# Patient Record
Sex: Male | Born: 1940 | Race: White | Hispanic: No | Marital: Married | Smoking: Former smoker
Health system: Southern US, Community
[De-identification: ages and names within clinical notes are randomized; demographics above are authoritative.]

## PROBLEM LIST (undated history)

## (undated) DIAGNOSIS — I4891 Unspecified atrial fibrillation: Secondary | ICD-10-CM

## (undated) DIAGNOSIS — I509 Heart failure, unspecified: Secondary | ICD-10-CM

## (undated) DIAGNOSIS — Z950 Presence of cardiac pacemaker: Secondary | ICD-10-CM

## (undated) DIAGNOSIS — E119 Type 2 diabetes mellitus without complications: Secondary | ICD-10-CM

## (undated) DIAGNOSIS — Z9289 Personal history of other medical treatment: Secondary | ICD-10-CM

## (undated) DIAGNOSIS — I10 Essential (primary) hypertension: Secondary | ICD-10-CM

## (undated) DIAGNOSIS — J449 Chronic obstructive pulmonary disease, unspecified: Secondary | ICD-10-CM

## (undated) HISTORY — PX: PACEMAKER INSERTION: SHX728

## (undated) HISTORY — PX: CARDIAC SURGERY: SHX584

## (undated) HISTORY — DX: Type 2 diabetes mellitus without complications: E11.9

---

## 2017-08-21 ENCOUNTER — Emergency Department
Admission: EM | Admit: 2017-08-21 | Discharge: 2017-08-21 | Disposition: A | Discharge status: Home / Self Care | Payer: Medicare Other | Attending: Emergency Medicine | Admitting: Emergency Medicine

## 2017-08-21 ENCOUNTER — Other Ambulatory Visit: Payer: Self-pay

## 2017-08-21 ENCOUNTER — Emergency Department: Payer: Medicare Other

## 2017-08-21 DIAGNOSIS — S5012XA Contusion of left forearm, initial encounter: Secondary | ICD-10-CM | POA: Diagnosis not present

## 2017-08-21 DIAGNOSIS — W208XXA Other cause of strike by thrown, projected or falling object, initial encounter: Secondary | ICD-10-CM | POA: Insufficient documentation

## 2017-08-21 DIAGNOSIS — Z87891 Personal history of nicotine dependence: Secondary | ICD-10-CM | POA: Diagnosis not present

## 2017-08-21 DIAGNOSIS — S51819A Laceration without foreign body of unspecified forearm, initial encounter: Secondary | ICD-10-CM

## 2017-08-21 DIAGNOSIS — Y9389 Activity, other specified: Secondary | ICD-10-CM | POA: Insufficient documentation

## 2017-08-21 DIAGNOSIS — J449 Chronic obstructive pulmonary disease, unspecified: Secondary | ICD-10-CM | POA: Insufficient documentation

## 2017-08-21 DIAGNOSIS — I110 Hypertensive heart disease with heart failure: Secondary | ICD-10-CM | POA: Diagnosis not present

## 2017-08-21 DIAGNOSIS — Y999 Unspecified external cause status: Secondary | ICD-10-CM | POA: Insufficient documentation

## 2017-08-21 DIAGNOSIS — S59912A Unspecified injury of left forearm, initial encounter: Secondary | ICD-10-CM | POA: Diagnosis present

## 2017-08-21 DIAGNOSIS — Y929 Unspecified place or not applicable: Secondary | ICD-10-CM | POA: Diagnosis not present

## 2017-08-21 DIAGNOSIS — Z950 Presence of cardiac pacemaker: Secondary | ICD-10-CM | POA: Insufficient documentation

## 2017-08-21 DIAGNOSIS — I509 Heart failure, unspecified: Secondary | ICD-10-CM | POA: Diagnosis not present

## 2017-08-21 HISTORY — DX: Heart failure, unspecified: I50.9

## 2017-08-21 HISTORY — DX: Essential (primary) hypertension: I10

## 2017-08-21 HISTORY — DX: Personal history of other medical treatment: Z92.89

## 2017-08-21 HISTORY — DX: Chronic obstructive pulmonary disease, unspecified: J44.9

## 2017-08-21 HISTORY — DX: Presence of cardiac pacemaker: Z95.0

## 2017-08-21 MED ORDER — TRAMADOL HCL 50 MG PO TABS: 50.0000 mg | ORAL_TABLET | Freq: Four times a day (QID) | ORAL | 0 refills | Status: DC | PRN

## 2017-08-21 MED ORDER — TETANUS-DIPHTH-ACELL PERTUSSIS 5-2.5-18.5 LF-MCG/0.5 IM SUSP
0.5000 mL | Freq: Once | INTRAMUSCULAR | Status: AC
  Administered 2017-08-21: 0.5 mL via INTRAMUSCULAR
  Filled 2017-08-21: qty 0.5

## 2017-08-21 NOTE — ED Notes (Signed)
See triage note  States he lifted up the top of a coffee table  The lid didn't lock and came down onto left forearm  Skin tear noted to forearm

## 2017-08-21 NOTE — ED Provider Notes (Signed)
[facility] Emergency Department Provider Note   ____________________________________________   First MD Initiated Contact with Patient 08/21/17 1721     (approximate)  I have reviewed the triage vital signs and the nursing notes.   HISTORY  Chief Complaint Arm Injury   HPI Guy Castillo is a 76 y.o. male is here after an injury to his left forearm. Patient states she was lifting a cedar coffee table top when it came down on his arm. Bleeding is under control. Patient is uncertain when the last tetanus booster he had.Patient has continued to use his arm without any difficulty.he rates his pain as 2/10.   Past Medical History:  Diagnosis Date  . CHF (congestive heart failure) ([place])   . COPD (chronic obstructive pulmonary disease) ([place])   . History of blood transfusion   . Hypertension   . Pacemaker     There are no active problems to display for this patient.   Past Surgical History:  Procedure Laterality Date  . CARDIAC SURGERY    . PACEMAKER INSERTION      Prior to Admission medications   Medication Sig Start Date End Date Taking? Authorizing Provider  traMADol (ULTRAM) 50 MG tablet Take 1 tablet (50 mg total) by mouth every 6 (six) hours as needed for moderate pain. 08/21/17   [name], PA-C    Allergies Tape  History reviewed. No pertinent family history.  Social History Social History   Tobacco Use  . Smoking status: Former Smoker  Substance Use Topics  . Alcohol use: Yes  . Drug use: Not on file    Review of Systems Constitutional: No fever/chills Cardiovascular: Denies chest pain. Respiratory: Denies shortness of breath.  Gastrointestinal: No abdominal pain.  No nausea, no vomiting.  Musculoskeletal: Negative for back pain. Skin: positive for skin tear left forearm. Neurological: Negative for headaches, focal weakness or numbness. ____________________________________________   PHYSICAL EXAM:  VITAL  SIGNS: ED Triage Vitals  Enc Vitals Group     BP 08/21/17 1658 (!) 162/104     Pulse Rate 08/21/17 1658 94     Resp 08/21/17 1658 (!) 22     Temp 08/21/17 1658 99.1 F (37.3 C)     Temp Source 08/21/17 1658 Oral     SpO2 08/21/17 1658 94 %     Weight 08/21/17 1659 221 lb (100.2 kg)     Height 08/21/17 1659 5' 10.5" (1.791 m)     Head Circumference --      Peak Flow --      Pain Score 08/21/17 1705 2     Pain Loc --      Pain Edu? --      Excl. in [place]? --    Constitutional: Alert and oriented. Well appearing and in no acute distress. Eyes: Conjunctivae are normal.  Head: Atraumatic. Neck: No stridor.  Cardiovascular: Normal rate, regular rhythm. Grossly normal heart sounds.  Good peripheral circulation. Respiratory: Normal respiratory effort.  No retractions. Lungs CTAB. Musculoskeletal: range of motion of the left forearm and wrist is without restriction and with minimal discomfort. Neurologic:  Normal speech and language. No gross focal neurologic deficits are appreciated. No gait instability. Skin:  Skin is warm, dry.  Superficial skin tear on the dorsal aspect of the left forearm. No active bleeding is present. No foreign body is noted. Psychiatric: Mood and affect are normal. Speech and behavior are normal.  ____________________________________________   LABS (all labs ordered are listed, but only abnormal  results are displayed)  Labs Reviewed - No data to display  RADIOLOGY  Dg Forearm Left  Result Date: 08/21/2017 CLINICAL DATA:  Coffee table fell on forearm. EXAM: LEFT FOREARM - 2 VIEW COMPARISON:  None. FINDINGS: There is no evidence of fracture or other focal bone lesions. Osteopenia. Soft tissues are unremarkable. IMPRESSION: Negative. Electronically Signed   By: Titus Dubin M.D.   On: 08/21/2017 17:56    ____________________________________________   PROCEDURES  Procedure(s) performed: None  Procedures  Critical Care performed:  No  ____________________________________________   INITIAL IMPRESSION / ASSESSMENT AND PLAN / ED COURSE  Tetanus booster was given to the patient. The skin tear was dressed with restore and patient was instructed to leave this on for 72 hours. He is follow-up with his PCP if any continued problems. X-rays were reassuring that there is no bone injury during this accident. Patient was given a prescription for tramadol 50 mg 1 every 6 hours if needed for pain. Patient states that he will try Tylenol first before taking pain medication.  ____________________________________________   FINAL CLINICAL IMPRESSION(S) / ED DIAGNOSES  Final diagnoses:  Contusion of left forearm, initial encounter  Skin tear of forearm without complication, initial encounter     ED Discharge Orders        Ordered    traMADol (ULTRAM) 50 MG tablet  Every 6 hours PRN     08/21/17 1809       Note:  This document was prepared using Dragon voice recognition software and may include unintentional dictation errors.    [name], PA-C 08/21/17 2302    Earleen Newport, MD 08/21/17 [phone]

## 2017-08-21 NOTE — ED Triage Notes (Signed)
Pt arrives with L FA skin tear from wooden table. Bleeding controlled. Gauze over arm. Takes eliquis. Denies hitting any other part of body. Tetanus NOT utd.

## 2017-08-21 NOTE — Discharge Instructions (Signed)
Follow up with your primary care provider if any continued problems. Restore was placed to your skin tear. You will  leave dressing on for 72 hours.  Ice and elevate if needed for swelling and pain. Tramadol every 6 hours if needed for pain. This medication could cause drowsiness and increase your risk for falling.

## 2018-01-15 ENCOUNTER — Telehealth: Payer: Self-pay | Admitting: Podiatry

## 2018-01-15 ENCOUNTER — Ambulatory Visit (INDEPENDENT_AMBULATORY_CARE_PROVIDER_SITE_OTHER): Payer: Medicare Other | Admitting: Podiatry

## 2018-01-15 ENCOUNTER — Encounter: Payer: Self-pay | Admitting: Podiatry

## 2018-01-15 VITALS — BP 168/86 | HR 78

## 2018-01-15 DIAGNOSIS — L84 Corns and callosities: Secondary | ICD-10-CM | POA: Diagnosis not present

## 2018-01-15 DIAGNOSIS — E11628 Type 2 diabetes mellitus with other skin complications: Secondary | ICD-10-CM | POA: Diagnosis not present

## 2018-01-15 DIAGNOSIS — S98132S Complete traumatic amputation of one left lesser toe, sequela: Secondary | ICD-10-CM | POA: Diagnosis not present

## 2018-01-15 NOTE — Progress Notes (Signed)
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  He says this calluses painful walking and wearing his shoes.  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  This patient has become diabetic and is taking metformin.  He presents the office today for an evaluation of his painful callus, left foot.  General Appearance  Alert, conversant and in no acute stress.  Vascular  Dorsalis pedis and posterior tibial  pulses are palpable  bilaterally.  Capillary return is within normal limits  bilaterally. Temperature is within normal limits  bilaterally.  Neurologic  Senn-Weinstein monofilament wire test diminished  bilaterally. Muscle power within normal limits bilaterally.  Nails normal  nails noted with no evidence of bacterial or fungal infection five toes right foot.  Orthopedic  No limitations of motion of motion feet .  No crepitus or effusions noted.  No bony pathology or digital deformities noted. Amputation 1-5 toes left foot.  Skin  normotropic skin with no porokeratosis noted bilaterally.  No signs of infections or ulcers noted.  Callus noted at the distal aspect of the first metatarsal head, left foot.  There is also a localized skin lesion noted under the third metatarsal left  Foot  Pre-ulcerous callus left foot.    IE.  Debridement of callus noted at the site of the amputation left foot.  Skin lesion was to provided under the third metatarsal and found a hair  noted at the site.  No infection.  RTC 4 months.   Gardiner Barefoot DPM

## 2018-01-15 NOTE — Telephone Encounter (Signed)
[facility] would like to have notes faxed to [phone] ATTN: Laqueta Linden

## 2018-01-15 NOTE — Telephone Encounter (Signed)
Entered in error - please disregard

## 2018-01-16 ENCOUNTER — Telehealth: Payer: Self-pay | Admitting: Podiatry

## 2018-01-16 NOTE — Telephone Encounter (Signed)
This is Laqueta Linden calling from the [facility]. I'm calling to obtain the office visit notes from date of service 15 Jan 2018. If you could fax those to me at [phone]. If you have any questions, you can call [phone] and ask to speak to me. Thank you and have a great day.

## 2018-05-01 ENCOUNTER — Encounter: Payer: Self-pay | Admitting: Podiatry

## 2018-05-01 ENCOUNTER — Ambulatory Visit (INDEPENDENT_AMBULATORY_CARE_PROVIDER_SITE_OTHER): Payer: Medicare Other | Admitting: Podiatry

## 2018-05-01 DIAGNOSIS — T148XXA Other injury of unspecified body region, initial encounter: Secondary | ICD-10-CM

## 2018-05-03 NOTE — Progress Notes (Signed)
   HPI: 77 year old male with PMHx of DM presenting today with a chief complaint of stabbing pain to the right heel that began 2-3 days ago. He states walking increases the pain. He believes he has a plantar wart or a callus present. He has been picking at the area trying to fix it. Patient is here for further evaluation and treatment.   Past Medical History:  Diagnosis Date  . CHF (congestive heart failure) ([place])   . COPD (chronic obstructive pulmonary disease) ([place])   . Diabetes mellitus without complication ([place])   . History of blood transfusion   . Hypertension   . Pacemaker      Physical Exam: General: The patient is alert and oriented x3 in no acute distress.  Dermatology: Splinter object noted to the right plantar heel. Skin is warm, dry and supple bilateral lower extremities.   Vascular: Palpable pedal pulses bilaterally. No edema or erythema noted. Capillary refill within normal limits.  Neurological: Epicritic and protective threshold grossly intact bilaterally.   Musculoskeletal Exam: Range of motion within normal limits to all pedal and ankle joints bilateral. Muscle strength 5/5 in all groups bilateral.   Assessment: 1. Splinter object right plantar heel    Plan of Care:  1. Patient evaluated.  2. Splinter removed with debridement with tissue nipper.  3. Recommended applying antibiotic ointment daily for one week with a Band-Aid.  4. Return to clinic as needed.      Edrick Kins, DPM [facility]  Dr. Edrick Kins, DPM    [address], [place] [address]                Office [phone]  Fax [phone]

## 2018-05-14 ENCOUNTER — Ambulatory Visit: Payer: Non-veteran care | Admitting: Podiatry

## 2019-05-01 ENCOUNTER — Encounter: Payer: Self-pay | Admitting: Emergency Medicine

## 2019-05-01 ENCOUNTER — Emergency Department
Admission: EM | Admit: 2019-05-01 | Discharge: 2019-05-01 | Disposition: A | Discharge status: Home / Self Care | Payer: No Typology Code available for payment source | Attending: Emergency Medicine | Admitting: Emergency Medicine

## 2019-05-01 ENCOUNTER — Other Ambulatory Visit: Payer: Self-pay

## 2019-05-01 ENCOUNTER — Emergency Department: Payer: No Typology Code available for payment source

## 2019-05-01 DIAGNOSIS — R410 Disorientation, unspecified: Secondary | ICD-10-CM | POA: Diagnosis not present

## 2019-05-01 DIAGNOSIS — I509 Heart failure, unspecified: Secondary | ICD-10-CM | POA: Insufficient documentation

## 2019-05-01 DIAGNOSIS — E119 Type 2 diabetes mellitus without complications: Secondary | ICD-10-CM | POA: Diagnosis not present

## 2019-05-01 DIAGNOSIS — R4182 Altered mental status, unspecified: Secondary | ICD-10-CM | POA: Diagnosis present

## 2019-05-01 DIAGNOSIS — Z950 Presence of cardiac pacemaker: Secondary | ICD-10-CM | POA: Insufficient documentation

## 2019-05-01 DIAGNOSIS — Z79899 Other long term (current) drug therapy: Secondary | ICD-10-CM | POA: Insufficient documentation

## 2019-05-01 DIAGNOSIS — J449 Chronic obstructive pulmonary disease, unspecified: Secondary | ICD-10-CM | POA: Insufficient documentation

## 2019-05-01 DIAGNOSIS — Z7982 Long term (current) use of aspirin: Secondary | ICD-10-CM | POA: Insufficient documentation

## 2019-05-01 DIAGNOSIS — I110 Hypertensive heart disease with heart failure: Secondary | ICD-10-CM | POA: Insufficient documentation

## 2019-05-01 LAB — COMPREHENSIVE METABOLIC PANEL
ALT: 16 U/L (ref 0–44)
AST: 19 U/L (ref 15–41)
Albumin: 3.9 g/dL (ref 3.5–5.0)
Alkaline Phosphatase: 71 U/L (ref 38–126)
Anion gap: 10 (ref 5–15)
BUN: 18 mg/dL (ref 8–23)
CO2: 26 mmol/L (ref 22–32)
Calcium: 9 mg/dL (ref 8.9–10.3)
Chloride: 106 mmol/L (ref 98–111)
Creatinine, Ser: 1.18 mg/dL (ref 0.61–1.24)
GFR calc Af Amer: >60 mL/min (ref >60)
GFR calc non Af Amer: 58 mL/min — ABNORMAL LOW (ref >60)
Glucose, Bld: 137 mg/dL — ABNORMAL HIGH (ref 70–99)
Potassium: 3.8 mmol/L (ref 3.5–5.1)
Sodium: 142 mmol/L (ref 135–145)
Total Bilirubin: 0.5 mg/dL (ref 0.3–1.2)
Total Protein: 7 g/dL (ref 6.5–8.1)

## 2019-05-01 LAB — CBC
HCT: 44.5 % (ref 39.0–52.0)
Hemoglobin: 13.7 g/dL (ref 13.0–17.0)
MCH: 28.4 pg (ref 26.0–34.0)
MCHC: 30.8 g/dL (ref 30.0–36.0)
MCV: 92.1 fL (ref 80.0–100.0)
Platelets: 306 10*3/uL (ref 150–400)
RBC: 4.83 MIL/uL (ref 4.22–5.81)
RDW: 14.6 % (ref 11.5–15.5)
WBC: 8.6 10*3/uL (ref 4.0–10.5)
nRBC: 0 % (ref 0.0–0.2)

## 2019-05-01 LAB — URINALYSIS, COMPLETE (UACMP) WITH MICROSCOPIC
Bacteria, UA: NONE SEEN
Bilirubin Urine: NEGATIVE
Glucose, UA: NEGATIVE mg/dL
Hgb urine dipstick: NEGATIVE
Ketones, ur: NEGATIVE mg/dL
Leukocytes,Ua: NEGATIVE
Nitrite: NEGATIVE
Protein, ur: NEGATIVE mg/dL
Specific Gravity, Urine: 1.023 (ref 1.005–1.030)
Squamous Epithelial / LPF: NONE SEEN (ref 0–5)
pH: 5 (ref 5.0–8.0)

## 2019-05-01 LAB — PROTIME-INR
INR: 1.2 (ref 0.8–1.2)
Prothrombin Time: 14.8 seconds (ref 11.4–15.2)

## 2019-05-01 NOTE — ED Triage Notes (Signed)
Patient brought in by ems from home. Wife reported to ems that for the last month the patient has been waking up about 03:00 in the morning with confusion. Per ems the confusion last for a short time and the becomes oriented. Patient alert and oriented at this time. Per ems fsbs 116, bp 152/80 and oxygen 92% on room air.

## 2019-05-01 NOTE — ED Notes (Signed)
Pt sitting quietly in lobby, wife screened and is now sitting with the patient in the lobby. Wife states the patient wakes up frequently in the middle of the night thinking someone is in the house or after him.  Pt is alert and cooperative, frequently seen walking around in lobby.  Denies any needs at this time. Delay explained to patient and wife. Verbalized understanding.

## 2019-05-01 NOTE — Discharge Instructions (Addendum)
Please follow-up with your primary care doctor within the next 2 days for recheck/reevaluation and to discuss your symptoms further.

## 2019-05-01 NOTE — ED Provider Notes (Signed)
[facility] Emergency Department Provider Note  Time seen: 8:34 AM  I have reviewed the triage vital signs and the nursing notes.   HISTORY  Chief Complaint Altered Mental Status  HPI Guy Castillo is a 78 y.o. male with a past medical history of CHF, COPD, diabetes, hypertension, presents to the emergency department for intermittent confusion.  According to the wife over the past 1 month or longer the patient has been experiencing intermittent confusion.  She states that mostly occurs at night when the patient first awakes overnight, at times he will have visual hallucinations such as someone walking in the room.   Wife states after several minutes the confusion clears.  Currently the patient is awake alert, no acute distress.  Patient has no acute concerns.  Denies any recent fever cough congestion or shortness of breath.  Past Medical History:  Diagnosis Date  . CHF (congestive heart failure) ([place])   . COPD (chronic obstructive pulmonary disease) ([place])   . Diabetes mellitus without complication ([place])   . History of blood transfusion   . Hypertension   . Pacemaker     There are no active problems to display for this patient.   Past Surgical History:  Procedure Laterality Date  . CARDIAC SURGERY    . PACEMAKER INSERTION      Prior to Admission medications   Medication Sig Start Date End Date Taking? Authorizing Provider  albuterol (PROVENTIL HFA) 108 (90 Base) MCG/ACT inhaler Inhale into the lungs.    [provider]  aspirin (ASPIRIN CHILDRENS) 81 MG chewable tablet Chew by mouth.    [provider]  atorvastatin (LIPITOR) 20 MG tablet Take by mouth.    [provider]  B Complex Vitamins (VITAMIN-B COMPLEX) TABS Take 1,000 mcg by mouth daily.    [provider]  diltiazem (CARDIZEM) 120 MG tablet Take by mouth 2 (two) times daily.    [provider]  ferrous sulfate 325 (65 FE) MG tablet Take by mouth.     [provider]  furosemide (LASIX) 40 MG tablet Take by mouth.    [provider]  gabapentin (NEURONTIN) 600 MG tablet Take by mouth.    [provider]  Ginkgo Biloba 60 MG CAPS Take by mouth.    [provider]  isosorbide mononitrate (IMDUR) 30 MG 24 hr tablet Take by mouth.    [provider]  lisinopril (PRINIVIL,ZESTRIL) 10 MG tablet Take by mouth.    [provider]  metFORMIN (GLUCOPHAGE) 500 MG tablet Take by mouth 2 (two) times daily.    [provider]  metoprolol tartrate (LOPRESSOR) 100 MG tablet Take by mouth 2 (two) times daily.    [provider]  Omeprazole 20 MG TBDD Take by mouth.    [provider]  tamsulosin (FLOMAX) 0.4 MG CAPS capsule Take by mouth.    [provider]  traMADol (ULTRAM) 50 MG tablet Take 1 tablet (50 mg total) by mouth every 6 (six) hours as needed for moderate pain. 08/21/17   [name], PA-C  UNABLE TO FIND Take by mouth. apixaban (ELIQUIS ORAL)    [provider]    Allergies  Allergen Reactions  . Tape Other (See Comments)    No family history on file.  Social History Social History   Tobacco Use  . Smoking status: Former Research scientist (life sciences)  . Smokeless tobacco: Never Used  Substance Use Topics  . Alcohol use: Not Currently  . Drug use:  Never    Review of Systems Constitutional: Negative for fever. ENT: Negative for recent illness/congestion Cardiovascular: Negative for chest pain. Respiratory: Negative for shortness of breath. Gastrointestinal: Negative for abdominal pain Genitourinary: Negative for urinary compaints Musculoskeletal: Negative for musculoskeletal complaints Skin: Negative for skin complaints  Neurological: Negative for headache All other ROS negative  ____________________________________________   PHYSICAL EXAM:  VITAL SIGNS: ED Triage Vitals [05/01/19 0338]  Enc Vitals Group     BP 136/86     Pulse Rate 64      Resp 20     Temp 98.2 F (36.8 C)     Temp Source Oral     SpO2 92 %     Weight 212 lb (96.2 kg)     Height 5\' 11"  (1.803 m)     Head Circumference      Peak Flow      Pain Score 0     Pain Loc      Pain Edu?      Excl. in [place]?    Constitutional: Alert and oriented. Well appearing and in no distress. Eyes: Normal exam ENT      Head: Normocephalic and atraumatic.      Mouth/Throat: Mucous membranes are moist. Cardiovascular: Normal rate, regular rhythm Respiratory: Normal respiratory effort without tachypnea nor retractions. Breath sounds are clear Gastrointestinal: Soft and nontender. No distention.  Musculoskeletal: Nontender with normal range of motion in all extremities.  Neurologic:  Normal speech and language. No gross focal neurologic deficits  Skin:  Skin is warm, dry and intact.  Psychiatric: Mood and affect are normal.   ____________________________________________   RADIOLOGY  CT scan shows no acute abnormality.  ____________________________________________   INITIAL IMPRESSION / ASSESSMENT AND PLAN / ED COURSE  Pertinent labs & imaging results that were available during my care of the patient were reviewed by me and considered in my medical decision making (see chart for details).   Patient presents to the emergency department for intermittent confusion.  Differential would include obstructive sleep apnea, dementia such as Lewy body dementia, infectious etiology such as urinary tract infection, metabolic or electrolyte abnormality.  We will check labs, CT scan of the head, urinalysis and continue to closely monitor.  Patient wife agreeable to plan of care.  Patient's work-up is overall reassuring.  Urinalysis is negative.  CT scan of the head is negative.  Blood work reassuring.  Overall I still suspect the patient symptoms are likely related to either sleep apnea versus dementia.  I have recommended they follow-up with their primary care physician.  They are  agreeable to plan of care.  Liran Nerison was evaluated in Emergency Department on 05/01/2019 for the symptoms described in the history of present illness. He was evaluated in the context of the global COVID-19 pandemic, which necessitated consideration that the patient might be at risk for infection with the SARS-CoV-2 virus that causes COVID-19. Institutional protocols and algorithms that pertain to the evaluation of patients at risk for COVID-19 are in a state of rapid change based on information released by regulatory bodies including the CDC and federal and state organizations. These policies and algorithms were followed during the patient's care in the ED.  ____________________________________________   FINAL CLINICAL IMPRESSION(S) / ED DIAGNOSES  Confusion   Harvest Dark, MD 05/01/19 [phone]

## 2019-06-04 ENCOUNTER — Emergency Department: Payer: Medicare Other

## 2019-06-04 ENCOUNTER — Inpatient Hospital Stay
Admission: EM | Admit: 2019-06-04 | Discharge: 2019-06-08 | DRG: 186 | Disposition: A | Discharge status: Other Acute Inpatient Hospital | Payer: Medicare Other | Source: Skilled Nursing Facility | Attending: Internal Medicine | Admitting: Internal Medicine

## 2019-06-04 ENCOUNTER — Encounter: Payer: Self-pay | Admitting: Emergency Medicine

## 2019-06-04 ENCOUNTER — Other Ambulatory Visit: Payer: Self-pay

## 2019-06-04 DIAGNOSIS — R9389 Abnormal findings on diagnostic imaging of other specified body structures: Secondary | ICD-10-CM | POA: Diagnosis present

## 2019-06-04 DIAGNOSIS — Z87891 Personal history of nicotine dependence: Secondary | ICD-10-CM

## 2019-06-04 DIAGNOSIS — I5032 Chronic diastolic (congestive) heart failure: Secondary | ICD-10-CM | POA: Diagnosis present

## 2019-06-04 DIAGNOSIS — I482 Chronic atrial fibrillation, unspecified: Secondary | ICD-10-CM | POA: Diagnosis present

## 2019-06-04 DIAGNOSIS — Z8249 Family history of ischemic heart disease and other diseases of the circulatory system: Secondary | ICD-10-CM | POA: Diagnosis not present

## 2019-06-04 DIAGNOSIS — I110 Hypertensive heart disease with heart failure: Secondary | ICD-10-CM | POA: Diagnosis present

## 2019-06-04 DIAGNOSIS — E876 Hypokalemia: Secondary | ICD-10-CM | POA: Diagnosis present

## 2019-06-04 DIAGNOSIS — J449 Chronic obstructive pulmonary disease, unspecified: Secondary | ICD-10-CM | POA: Diagnosis present

## 2019-06-04 DIAGNOSIS — I10 Essential (primary) hypertension: Secondary | ICD-10-CM | POA: Diagnosis present

## 2019-06-04 DIAGNOSIS — Z9889 Other specified postprocedural states: Secondary | ICD-10-CM

## 2019-06-04 DIAGNOSIS — E119 Type 2 diabetes mellitus without complications: Secondary | ICD-10-CM | POA: Diagnosis present

## 2019-06-04 DIAGNOSIS — Z20828 Contact with and (suspected) exposure to other viral communicable diseases: Secondary | ICD-10-CM | POA: Diagnosis present

## 2019-06-04 DIAGNOSIS — Z950 Presence of cardiac pacemaker: Secondary | ICD-10-CM

## 2019-06-04 DIAGNOSIS — Z801 Family history of malignant neoplasm of trachea, bronchus and lung: Secondary | ICD-10-CM

## 2019-06-04 DIAGNOSIS — Z7902 Long term (current) use of antithrombotics/antiplatelets: Secondary | ICD-10-CM | POA: Diagnosis not present

## 2019-06-04 DIAGNOSIS — Z7984 Long term (current) use of oral hypoglycemic drugs: Secondary | ICD-10-CM | POA: Diagnosis not present

## 2019-06-04 DIAGNOSIS — J90 Pleural effusion, not elsewhere classified: Secondary | ICD-10-CM | POA: Diagnosis present

## 2019-06-04 DIAGNOSIS — Z79899 Other long term (current) drug therapy: Secondary | ICD-10-CM | POA: Diagnosis not present

## 2019-06-04 DIAGNOSIS — C189 Malignant neoplasm of colon, unspecified: Secondary | ICD-10-CM | POA: Diagnosis present

## 2019-06-04 DIAGNOSIS — J9621 Acute and chronic respiratory failure with hypoxia: Secondary | ICD-10-CM | POA: Diagnosis present

## 2019-06-04 DIAGNOSIS — Z66 Do not resuscitate: Secondary | ICD-10-CM | POA: Diagnosis present

## 2019-06-04 HISTORY — DX: Unspecified atrial fibrillation: I48.91

## 2019-06-04 LAB — CBC WITH DIFFERENTIAL/PLATELET
Abs Immature Granulocytes: 0.03 10*3/uL (ref 0.00–0.07)
Basophils Absolute: 0.1 10*3/uL (ref 0.0–0.1)
Basophils Relative: 1 %
Eosinophils Absolute: 0.1 10*3/uL (ref 0.0–0.5)
Eosinophils Relative: 1 %
HCT: 40.7 % (ref 39.0–52.0)
Hemoglobin: 12.4 g/dL — ABNORMAL LOW (ref 13.0–17.0)
Immature Granulocytes: 0 %
Lymphocytes Relative: 8 %
Lymphs Abs: 0.9 10*3/uL (ref 0.7–4.0)
MCH: 29 pg (ref 26.0–34.0)
MCHC: 30.5 g/dL (ref 30.0–36.0)
MCV: 95.1 fL (ref 80.0–100.0)
Monocytes Absolute: 1.1 10*3/uL — ABNORMAL HIGH (ref 0.1–1.0)
Monocytes Relative: 11 %
Neutro Abs: 8.6 10*3/uL — ABNORMAL HIGH (ref 1.7–7.7)
Neutrophils Relative %: 79 %
Platelets: 356 10*3/uL (ref 150–400)
RBC: 4.28 MIL/uL (ref 4.22–5.81)
RDW: 16.5 % — ABNORMAL HIGH (ref 11.5–15.5)
WBC: 10.8 10*3/uL — ABNORMAL HIGH (ref 4.0–10.5)
nRBC: 0 % (ref 0.0–0.2)

## 2019-06-04 LAB — COMPREHENSIVE METABOLIC PANEL
ALT: 23 U/L (ref 0–44)
AST: 37 U/L (ref 15–41)
Albumin: 3.2 g/dL — ABNORMAL LOW (ref 3.5–5.0)
Alkaline Phosphatase: 80 U/L (ref 38–126)
Anion gap: 11 (ref 5–15)
BUN: 25 mg/dL — ABNORMAL HIGH (ref 8–23)
CO2: 23 mmol/L (ref 22–32)
Calcium: 8.6 mg/dL — ABNORMAL LOW (ref 8.9–10.3)
Chloride: 106 mmol/L (ref 98–111)
Creatinine, Ser: 1.25 mg/dL — ABNORMAL HIGH (ref 0.61–1.24)
GFR calc Af Amer: >60 mL/min (ref >60)
GFR calc non Af Amer: 54 mL/min — ABNORMAL LOW (ref >60)
Glucose, Bld: 142 mg/dL — ABNORMAL HIGH (ref 70–99)
Potassium: 3.9 mmol/L (ref 3.5–5.1)
Sodium: 140 mmol/L (ref 135–145)
Total Bilirubin: 1.2 mg/dL (ref 0.3–1.2)
Total Protein: 6.7 g/dL (ref 6.5–8.1)

## 2019-06-04 LAB — BRAIN NATRIURETIC PEPTIDE: B Natriuretic Peptide: 225 pg/mL — ABNORMAL HIGH (ref 0.0–100.0)

## 2019-06-04 LAB — PROCALCITONIN: Procalcitonin: <0.1 ng/mL

## 2019-06-04 LAB — SARS CORONAVIRUS 2 BY RT PCR (HOSPITAL ORDER, PERFORMED IN ~~LOC~~ HOSPITAL LAB): SARS Coronavirus 2: NEGATIVE

## 2019-06-04 LAB — APTT: aPTT: 32 seconds (ref 24–36)

## 2019-06-04 LAB — PROTIME-INR
INR: 1.2 (ref 0.8–1.2)
Prothrombin Time: 15 seconds (ref 11.4–15.2)

## 2019-06-04 LAB — LACTIC ACID, PLASMA: Lactic Acid, Venous: 2 mmol/L (ref 0.5–1.9)

## 2019-06-04 MED ORDER — ACETAMINOPHEN 500 MG PO TABS
1000.0000 mg | ORAL_TABLET | Freq: Once | ORAL | Status: AC
  Administered 2019-06-04: 1000 mg via ORAL
  Filled 2019-06-04: qty 2

## 2019-06-04 MED ORDER — SODIUM CHLORIDE 0.9 % IV BOLUS
500.0000 mL | Freq: Once | INTRAVENOUS | Status: AC
  Administered 2019-06-04: 21:00:00 500 mL via INTRAVENOUS

## 2019-06-04 MED ORDER — IOHEXOL 300 MG/ML  SOLN
75.0000 mL | Freq: Once | INTRAMUSCULAR | Status: AC | PRN
  Administered 2019-06-04: 22:00:00 75 mL via INTRAVENOUS

## 2019-06-04 MED ORDER — SODIUM CHLORIDE 0.9 % IV SOLN
2.0000 g | INTRAVENOUS | Status: DC
  Administered 2019-06-04 – 2019-06-06 (×3): 2 g via INTRAVENOUS
  Filled 2019-06-04: qty 20
  Filled 2019-06-04 (×2): qty 2

## 2019-06-04 MED ORDER — SODIUM CHLORIDE 0.9 % IV SOLN
500.0000 mg | INTRAVENOUS | Status: DC
  Administered 2019-06-04: 500 mg via INTRAVENOUS
  Filled 2019-06-04 (×2): qty 500

## 2019-06-04 NOTE — Progress Notes (Signed)
[facility] RN spoke with ED RN regarding patient's need for blood draw. Advised RN to get in touch with Phlebotomist as starting IV solely for the purpose of drawing blood is not within our scope of practice per hospital's policy.

## 2019-06-04 NOTE — ED Notes (Signed)
Lab here for lactic acid draw.

## 2019-06-04 NOTE — ED Notes (Signed)
Pt attempting to get out of bed. Pt assisted back into bed, oxygen replaced. Pt readjusted for comfort. Pt verbalizes understanding of need to use call bell, posey alarm in place,  Yellow non skid socks in place.

## 2019-06-04 NOTE — ED Notes (Signed)
This RN, Terri Piedra, RN and Larene Beach, RN attempted IV access and blood draw for blood cultures without success. IV team consult placed. MD [facility] aware. MD Monks verbal to wait to start IV antibiotics until blood cultures drawn by IV team.

## 2019-06-04 NOTE — H&P (Addendum)
[facility] [place] NAME: Guy Castillo    MR#:  [id number]  DATE OF BIRTH:  [birth year]  DATE OF ADMISSION:  06/04/2019  PRIMARY CARE PHYSICIAN: System, Pcp Not In   REQUESTING/REFERRING PHYSICIAN: Joan Mayans, MD  CHIEF COMPLAINT:   Chief Complaint  Patient presents with  . Shortness of Breath    HISTORY OF PRESENT ILLNESS:  Guy Castillo  is a 78 y.o. male who presents with chief complaint as above.  Patient presents the ED with a complaint of shortness of breath.  He is on 2 L of oxygen at home normally, and EMS increase this to 3 L.  His O2 sats and vital signs of been stable.  On evaluation here in the ED he was found to have an extremely large left-sided pleural effusion with mediastinal shift.  Absolute etiology of this effusion is unclear at this time.  He did also have a fever here and was given some antibiotics in the ED.  Hospitalist were called for admission  PAST MEDICAL HISTORY:   Past Medical History:  Diagnosis Date  . Atrial fibrillation ([place])   . CHF (congestive heart failure) ([place])   . COPD (chronic obstructive pulmonary disease) ([place])   . Diabetes mellitus without complication ([place])   . History of blood transfusion   . Hypertension   . Pacemaker      PAST SURGICAL HISTORY:   Past Surgical History:  Procedure Laterality Date  . CARDIAC SURGERY    . PACEMAKER INSERTION       SOCIAL HISTORY:   Social History   Tobacco Use  . Smoking status: Former Research scientist (life sciences)  . Smokeless tobacco: Never Used  Substance Use Topics  . Alcohol use: Not Currently     FAMILY HISTORY:   Family History  Problem Relation Age of Onset  . CAD Mother   . Lung cancer Mother   . CAD Father   . CAD Brother   . Lung cancer Brother      DRUG ALLERGIES:   Allergies  Allergen Reactions  . Tape Other (See Comments)    MEDICATIONS AT HOME:   Prior to Admission medications   Medication Sig Start Date End Date Taking?  Authorizing Provider  acetaminophen (TYLENOL) 325 MG tablet Take 650 mg by mouth every 8 (eight) hours as needed for mild pain, moderate pain or fever.   Yes [provider]  albuterol (PROVENTIL HFA) 108 (90 Base) MCG/ACT inhaler Inhale 2 puffs into the lungs 2 (two) times daily as needed for shortness of breath.    Yes [provider]  ammonium lactate (LAC-HYDRIN) 12 % lotion Apply 1 application topically daily. Apply a moderate amount to legs   Yes [provider]  apixaban (ELIQUIS) 5 MG TABS tablet Take 5 mg by mouth every 12 (twelve) hours.    Yes [provider]  atorvastatin (LIPITOR) 40 MG tablet Take 40 mg by mouth at bedtime.   Yes [provider]  dorzolamide (TRUSOPT) 2 % ophthalmic solution Place 1 drop into both eyes 3 (three) times daily.   Yes [provider]  ferrous sulfate 324 MG TBEC Take 324 mg by mouth daily with breakfast.   Yes [provider]  furosemide (LASIX) 40 MG tablet Take 40 mg by mouth daily.    Yes [provider]  gabapentin (NEURONTIN) 100 MG capsule Take 100 mg by mouth 3 (three) times daily.    Yes [provider]  latanoprost (XALATAN) 0.005 % ophthalmic solution Place 1 drop into both eyes at bedtime.   Yes [provider]  Melatonin 3 MG TABS Take 3 mg by mouth at bedtime.   Yes [provider]  metoprolol succinate (TOPROL-XL) 50 MG 24 hr tablet Take 150 mg by mouth daily. Take with or immediately following a meal.   Yes [provider]  omeprazole (PRILOSEC) 20 MG capsule Take 20 mg by mouth daily.   Yes [provider]  senna-docusate (SENOKOT-S) 8.6-50 MG tablet Take 2 tablets by mouth 2 (two) times daily as needed for mild constipation.   Yes [provider]  tamsulosin (FLOMAX) 0.4 MG CAPS capsule Take 0.4 mg by mouth at bedtime.   Yes [provider]  traZODone (DESYREL) 50 MG tablet Take 25 mg by mouth at bedtime as  needed for sleep.   Yes [provider]  urea (CARMOL) 20 % cream Apply 1 application topically 2 (two) times daily. Apply a moderate amount to left foot   Yes [provider]    REVIEW OF SYSTEMS:  Review of Systems  Constitutional: Positive for fever. Negative for chills, malaise/fatigue and weight loss.  HENT: Negative for ear pain, hearing loss and tinnitus.   Eyes: Negative for blurred vision, double vision, pain and redness.  Respiratory: Positive for shortness of breath. Negative for cough and hemoptysis.   Cardiovascular: Negative for chest pain, palpitations, orthopnea and leg swelling.  Gastrointestinal: Negative for abdominal pain, constipation, diarrhea, nausea and vomiting.  Genitourinary: Negative for dysuria, frequency and hematuria.  Musculoskeletal: Negative for back pain, joint pain and neck pain.  Skin:       No acne, rash, or lesions  Neurological: Negative for dizziness, tremors, focal weakness and weakness.  Endo/Heme/Allergies: Negative for polydipsia. Does not bruise/bleed easily.  Psychiatric/Behavioral: Negative for depression. The patient is not nervous/anxious and does not have insomnia.      VITAL SIGNS:   Vitals:   06/04/19 2232 06/04/19 2255 06/04/19 2315 06/04/19 2323  BP:  115/67 103/74   Pulse:  81  (!) 57  Resp:      Temp: 98.7 F (37.1 C)     TempSrc: Oral     SpO2:  98%  95%  Weight:      Height:       Wt Readings from Last 3 Encounters:  06/04/19 86.2 kg  05/01/19 96.2 kg  08/21/17 100.2 kg    PHYSICAL EXAMINATION:  Physical Exam  Vitals reviewed. Constitutional: He is oriented to person, place, and time. He appears well-developed and well-nourished. No distress.  HENT:  Head: Normocephalic and atraumatic.  Mouth/Throat: Oropharynx is clear and moist.  Eyes: Pupils are equal, round, and reactive to light. Conjunctivae and EOM are normal. No scleral icterus.  Neck: Normal range of motion. Neck supple. No JVD  present. No thyromegaly present.  Cardiovascular: Normal rate, regular rhythm and intact distal pulses. Exam reveals no gallop and no friction rub.  No murmur heard. Respiratory: Effort normal. No respiratory distress. He has no wheezes. He has no rales.  Absent breath sounds left hemithorax  GI: Soft. Bowel sounds are normal. He exhibits no distension. There is no abdominal tenderness.  Musculoskeletal: Normal range of motion.        General: No edema.     Comments: No arthritis, no gout  Lymphadenopathy:    He has no cervical adenopathy.  Neurological: He is alert and oriented to person, place, and time. No cranial nerve  deficit.  No dysarthria, no aphasia  Skin: Skin is warm and dry. No rash noted. No erythema.  Psychiatric: He has a normal mood and affect. His behavior is normal. Judgment and thought content normal.    LABORATORY PANEL:   CBC Recent Labs  Lab 06/04/19 1937  WBC 10.8*  HGB 12.4*  HCT 40.7  PLT 356   ------------------------------------------------------------------------------------------------------------------  Chemistries  Recent Labs  Lab 06/04/19 1937  NA 140  K 3.9  CL 106  CO2 23  GLUCOSE 142*  BUN 25*  CREATININE 1.25*  CALCIUM 8.6*  AST 37  ALT 23  ALKPHOS 80  BILITOT 1.2   ------------------------------------------------------------------------------------------------------------------  Cardiac Enzymes No results for input(s): TROPONINI in the last 168 hours. ------------------------------------------------------------------------------------------------------------------  RADIOLOGY:  Ct Chest W Contrast  Result Date: 06/04/2019 CLINICAL DATA:  Characterization of the left pleural effusion EXAM: CT CHEST WITH CONTRAST TECHNIQUE: Multidetector CT imaging of the chest was performed during intravenous contrast administration. CONTRAST:  51mL OMNIPAQUE IOHEXOL 300 MG/ML  SOLN COMPARISON:  Radiograph 06/04/2019 FINDINGS: Cardiovascular:  Cardiomegaly with biatrial enlargement. Dual lead pacer device with the battery pack in the left chest wall and leads at the right atrium and cardiac apex. There is cardiomegaly with biatrial enlargement. Atherosclerotic calcification of the coronary arteries. Calcifications are present on the aortic valve leaflets. Small volume of pericardial fluid. Satisfactory opacification the pulmonary arteries to the lobar level. No central or lobar filling defects are seen. Few foci of gas in the main pulmonary artery are likely related to intravenous access. Atherosclerotic plaque within the normal caliber aorta. Three-vessel arch. No acute luminal abnormality or periaortic stranding. Mediastinum/Nodes: Rightward shift of the mediastinum secondary to the volume positive process in the left hemithorax. No acute tracheal abnormality. Truncation of the left central airways as the inter the atelectatic lung. Thoracic esophagus is unremarkable. Lungs/Pleura: There is a large left pleural effusion with complete atelectatic collapse of the left lung. No abnormal pleural thickening or nodularity is evident. No abnormal hypoenhancement of the atelectatic lung parenchyma. Moderate centrilobular and paraseptal emphysematous changes are present in the right lung with some areas subpleural reticular change and superimposed atelectasis. Bandlike opacities in the bases may reflect subsegmental atelectasis or scarring. No suspicious pulmonary nodules or masses. No consolidation. No pneumothorax. No right effusion. Upper Abdomen: Partial fatty replacement of the pancreas. Fluid attenuation cyst in the upper pole right kidney No acute abnormalities present in the visualized portions of the upper abdomen. Musculoskeletal: Multilevel degenerative changes are present in the imaged portions of the spine. No acute osseous abnormality or suspicious osseous lesion. Several remote healed lower thoracic right lateral rib fractures are noted.  IMPRESSION: 1. Large left pleural effusion with complete atelectatic collapse of the left lung. No abnormal pleural thickening or nodularity. No abnormal parenchymal hypoattenuation of the collapsed lung. 2. Centrilobular and paraseptal emphysema within the remaining aerated right lung. Subpleural reticular changes suggesting least mild pulmonary fibrosis as well. 3. Cardiomegaly with biatrial enlargement. Trace pericardial effusion. 4. Aortic Atherosclerosis (ICD10-I70.0). 5. Coronary atherosclerosis. Electronically Signed   By: Lovena Le M.D.   On: 06/04/2019 22:02   Dg Chest Port 1 View  Result Date: 06/04/2019 CLINICAL DATA:  Left pleural effusion EXAM: PORTABLE CHEST 1 VIEW COMPARISON:  None. FINDINGS: Total opacification of the left hemithorax with leftward shift of the heart mediastinum, in keeping with large pleural effusion. There is mild interstitial opacity and compressive atelectasis of the right lung. Cardiomegaly with left chest multi lead pacer. IMPRESSION: 1. Total  opacification of the left hemithorax with leftward shift of the heart mediastinum, in keeping with large pleural effusion. There is mild interstitial opacity and compressive atelectasis of the right lung. 2.  Cardiomegaly with left chest multi lead pacer. Electronically Signed   By: Eddie Candle M.D.   On: 06/04/2019 20:16    EKG:   Orders placed or performed during the hospital encounter of 06/04/19  . EKG 12-Lead  . EKG 12-Lead    IMPRESSION AND PLAN:  Principal Problem:   Pleural effusion -patient will need thoracentesis with fluid sent for evaluation.  This has been ordered for the morning. Active Problems:   HTN (hypertension) -home dose antihypertensives   COPD (chronic obstructive pulmonary disease) ([place]) -continue home meds   Diabetes ([place]) -sliding scale insulin   Atrial fibrillation, chronic ([place]) -continue home meds, including anticoagulation  Chart review performed and case discussed with ED  provider. Labs, imaging and/or ECG reviewed by provider and discussed with patient/family. Management plans discussed with the patient and/or family.  COVID-19 status: Tested negative     DVT PROPHYLAXIS: Systemic anticoagulation  GI PROPHYLAXIS:  None  ADMISSION STATUS: Inpatient     CODE STATUS: Full Advance Directive Documentation     Most Recent Value  Type of Advance Directive  Out of facility DNR (pink MOST or yellow form)  Pre-existing out of facility DNR order (yellow form or pink MOST form)  Yellow form placed in chart (order not valid for inpatient use)  "MOST" Form in Place?  -      TOTAL TIME TAKING CARE OF THIS PATIENT: 45 minutes.   This patient was evaluated in the context of the global COVID-19 pandemic, which necessitated consideration that the patient might be at risk for infection with the SARS-CoV-2 virus that causes COVID-19. Institutional protocols and algorithms that pertain to the evaluation of patients at risk for COVID-19 are in a state of rapid change based on information released by regulatory bodies including the CDC and federal and state organizations. These policies and algorithms were followed to the best of this provider's knowledge to date during the patient's care at this facility.  Ethlyn Daniels 06/04/2019, 11:51 PM  [facility]  Office  [phone]  CC: Primary care physician; System, Pcp Not In  Note:  This document was prepared using Dragon voice recognition software and may include unintentional dictation errors.

## 2019-06-04 NOTE — ED Notes (Signed)
Lab notified of need for venipuncture assist for lactic acid.  

## 2019-06-04 NOTE — ED Triage Notes (Signed)
Pt presents from [facility] via [facility] with c/o concerning chest x-ray. Nurse practitioner at facility was concerned about chest x-ray. Chest x-ray showing Left plural effusion. NP at facility sent pt here to be checked out. Pt normally chronically on 2L. Pt was 88% on 2L, ems placed pt on 3L. Pt oxygen saturation now 100% on 3L. Pt currently alert and oriented x4 at this time. Pt denies shortness at this time.

## 2019-06-04 NOTE — ED Provider Notes (Signed)
[facility] Emergency Department Provider Note  ____________________________________________   First MD Initiated Contact with Patient 06/04/19 1939     (approximate)  I have reviewed the triage vital signs and the nursing notes.  History  Chief Complaint Shortness of Breath    HPI Guy Castillo is a 78 y.o. male with a history of CHF, COPD on 2 L nasal cannula, who presents to the emergency department for shortness of breath, found on imaging at his facility to have a left-sided pleural effusion.  Patient states that his shortness of breath started today.  He denies any associated fevers, chest pain, or cough.  He does report some significant bilateral lower extremity edema that comes and goes, but states this is pretty typical for him.  He is normally on 2 L nasal cannula.  Per EMS, he was hypoxic to 88% on 2 L, increased to 3 L nasal cannula with improvement in saturation.  On arrival to the emergency department, the patient states that shortness of breath is improving.   Past Medical Hx Past Medical History:  Diagnosis Date  . CHF (congestive heart failure) ([place])   . COPD (chronic obstructive pulmonary disease) ([place])   . Diabetes mellitus without complication ([place])   . History of blood transfusion   . Hypertension   . Pacemaker     Problem List There are no active problems to display for this patient.   Past Surgical Hx Past Surgical History:  Procedure Laterality Date  . CARDIAC SURGERY    . PACEMAKER INSERTION      Medications Prior to Admission medications   Medication Sig Start Date End Date Taking? Authorizing Provider  albuterol (PROVENTIL HFA) 108 (90 Base) MCG/ACT inhaler Inhale 1-2 puffs into the lungs every 6 (six) hours as needed for wheezing or shortness of breath.     [provider]  apixaban (ELIQUIS) 5 MG TABS tablet Take 5 mg by mouth 2 (two) times daily.    [provider]  atorvastatin (LIPITOR) 80 MG  tablet Take 40 mg by mouth at bedtime.     [provider]  ferrous sulfate 325 (65 FE) MG tablet Take 325 mg by mouth daily.     [provider]  furosemide (LASIX) 40 MG tablet Take 40 mg by mouth daily.     [provider]  gabapentin (NEURONTIN) 100 MG capsule Take 100 mg by mouth 2 (two) times daily.     [provider]  metFORMIN (GLUCOPHAGE) 500 MG tablet Take 500 mg by mouth 2 (two) times daily.     [provider]  metoprolol (TOPROL-XL) 200 MG 24 hr tablet Take 200 mg by mouth daily.    [provider]  Omeprazole 20 MG TBDD Take 20 mg by mouth daily.     [provider]  spironolactone (ALDACTONE) 25 MG tablet Take 25 mg by mouth daily.    [provider]    Allergies Tape  Family Hx History reviewed. No pertinent family history.  Social Hx Social History   Tobacco Use  . Smoking status: Former Research scientist (life sciences)  . Smokeless tobacco: Never Used  Substance Use Topics  . Alcohol use: Not Currently  . Drug use: Never     Review of Systems  Constitutional: Negative for fever, chills. Eyes: Negative for visual changes. ENT: Negative for sore throat. Cardiovascular: Negative for chest pain. Respiratory: + for shortness of breath. Gastrointestinal: Negative for nausea, vomiting.  Genitourinary: Negative for dysuria. Musculoskeletal: Negative  for leg swelling. Skin: Negative for rash. Neurological: Negative for for headaches.   Physical Exam  Vital Signs: ED Triage Vitals  Enc Vitals Group     BP 06/04/19 1932 111/70     Pulse Rate 06/04/19 1932 96     Resp 06/04/19 1932 (!) 30     Temp 06/04/19 1933 (!) 101.3 F (38.5 C)     Temp Source 06/04/19 1933 Oral     SpO2 06/04/19 1932 96 %     Weight 06/04/19 1934 190 lb (86.2 kg)     Height 06/04/19 1934 6' (1.829 m)     Head Circumference --      Peak Flow --      Pain Score 06/04/19 1934 0     Pain Loc --      Pain Edu? --      Excl. in [place]? --      Constitutional: Alert and oriented.  Head: Normocephalic. Atraumatic. Eyes: Conjunctivae clear. Sclera anicteric. Nose: No congestion. No rhinorrhea. Mouth/Throat: Mucous membranes are moist.  Neck: No stridor.   Cardiovascular: Irregularly irregular, rate within normal limits.  Extremities well perfused. Respiratory: Tachypneic, on 3 L nasal cannula, absence of audible lung sounds on the left. Gastrointestinal: Soft. Non-tender.  Musculoskeletal: Bilateral, symmetric pitting edema of the lower extremities to the upper shin. Neurologic:  Normal speech and language. No gross focal neurologic deficits are appreciated.  Skin: Skin is warm, dry and intact. No rash noted. Psychiatric: Mood and affect are appropriate for situation.  EKG  Personally reviewed.   Rate: 90s Rhythm: atrial fibrillation Axis: nromal Intervals: prolonged QTc Atrial fibrillation, prolonged QTC, no acute ischemic changes No STEMI    Radiology  XR: IMPRESSION: 1. Total opacification of the left hemithorax with leftward shift of the heart mediastinum, in keeping with large pleural effusion. There is mild interstitial opacity and compressive atelectasis of the right lung.  2. Cardiomegaly with left chest multi lead pacer.     Procedures  Procedure(s) performed (including critical care):  .Critical Care Performed by: Lilia Pro., MD Authorized by: Lilia Pro., MD   Critical care provider statement:    Critical care time (minutes):  30   Critical care was necessary to treat or prevent imminent or life-threatening deterioration of the following conditions:  Respiratory failure and sepsis   Critical care was time spent personally by me on the following activities:  Discussions with consultants, evaluation of patient's response to treatment, examination of patient, ordering and performing treatments and interventions, ordering and review of laboratory studies, ordering and review of  radiographic studies, pulse oximetry, re-evaluation of patient's condition, obtaining history from patient or surrogate and review of old charts     Initial Impression / Assessment and Plan / ED Course  78 y.o. male with a history of CHF, COPD on 2 L nasal cannula, who presents to the emergency department for shortness of breath, found on imaging at his facility to have a left-sided pleural effusion.    Ddx: infection, HF related, underlying malignancy, amongst others  Plan: labs, imaging.  Of note, he was found to be febrile on arrival to ED. Ordered blood cultures, lactate, procalcitonin, empiric antibiotics.  Repeat x-ray here confirms total opacification of the left hemithorax.  CT ordered for further characterization.  WBC count 10.8.  Chemistry notable for mild AKI, creatinine 1.25.  Discussed with hospitalist for admission.   Final Clinical Impression(s) / ED Diagnosis  Final diagnoses:  Pleural effusion  Note:  This document was prepared using Dragon voice recognition software and may include unintentional dictation errors.   Lilia Pro., MD 06/04/19 2053

## 2019-06-05 LAB — CBC
HCT: 39.8 % (ref 39.0–52.0)
Hemoglobin: 12 g/dL — ABNORMAL LOW (ref 13.0–17.0)
MCH: 28.7 pg (ref 26.0–34.0)
MCHC: 30.2 g/dL (ref 30.0–36.0)
MCV: 95.2 fL (ref 80.0–100.0)
Platelets: 312 10*3/uL (ref 150–400)
RBC: 4.18 MIL/uL — ABNORMAL LOW (ref 4.22–5.81)
RDW: 16.5 % — ABNORMAL HIGH (ref 11.5–15.5)
WBC: 9.3 10*3/uL (ref 4.0–10.5)
nRBC: 0 % (ref 0.0–0.2)

## 2019-06-05 LAB — BASIC METABOLIC PANEL
Anion gap: 10 (ref 5–15)
BUN: 23 mg/dL (ref 8–23)
CO2: 25 mmol/L (ref 22–32)
Calcium: 8.4 mg/dL — ABNORMAL LOW (ref 8.9–10.3)
Chloride: 107 mmol/L (ref 98–111)
Creatinine, Ser: 1 mg/dL (ref 0.61–1.24)
GFR calc Af Amer: >60 mL/min (ref >60)
GFR calc non Af Amer: >60 mL/min (ref >60)
Glucose, Bld: 109 mg/dL — ABNORMAL HIGH (ref 70–99)
Potassium: 3.4 mmol/L — ABNORMAL LOW (ref 3.5–5.1)
Sodium: 142 mmol/L (ref 135–145)

## 2019-06-05 LAB — GLUCOSE, CAPILLARY
Glucose-Capillary: 88 mg/dL (ref 70–99)
Glucose-Capillary: 95 mg/dL (ref 70–99)
Glucose-Capillary: 84 mg/dL (ref 70–99)
Glucose-Capillary: 140 mg/dL — ABNORMAL HIGH (ref 70–99)

## 2019-06-05 LAB — HEMOGLOBIN A1C
Hgb A1c MFr Bld: 5.9 % — ABNORMAL HIGH (ref 4.8–5.6)
Mean Plasma Glucose: 122.63 mg/dL

## 2019-06-05 LAB — LACTIC ACID, PLASMA: Lactic Acid, Venous: 1.9 mmol/L (ref 0.5–1.9)

## 2019-06-05 LAB — MRSA PCR SCREENING: MRSA by PCR: NEGATIVE

## 2019-06-05 MED ORDER — ACETAMINOPHEN 325 MG PO TABS: 650.0000 mg | ORAL_TABLET | Freq: Four times a day (QID) | ORAL | Status: DC | PRN

## 2019-06-05 MED ORDER — PANTOPRAZOLE SODIUM 40 MG PO TBEC
40.0000 mg | DELAYED_RELEASE_TABLET | Freq: Every day | ORAL | Status: DC
  Administered 2019-06-05 – 2019-06-08 (×4): 40 mg via ORAL
  Filled 2019-06-05 (×4): qty 1

## 2019-06-05 MED ORDER — ATORVASTATIN CALCIUM 20 MG PO TABS
40.0000 mg | ORAL_TABLET | Freq: Every day | ORAL | Status: DC
  Administered 2019-06-06 – 2019-06-07 (×2): 40 mg via ORAL
  Filled 2019-06-05 (×3): qty 2

## 2019-06-05 MED ORDER — AZITHROMYCIN 250 MG PO TABS
500.0000 mg | ORAL_TABLET | Freq: Every day | ORAL | Status: DC
  Administered 2019-06-05 – 2019-06-06 (×2): 500 mg via ORAL
  Filled 2019-06-05 (×2): qty 2

## 2019-06-05 MED ORDER — METOPROLOL SUCCINATE ER 50 MG PO TB24
150.0000 mg | ORAL_TABLET | Freq: Every day | ORAL | Status: DC
  Administered 2019-06-05 – 2019-06-08 (×4): 150 mg via ORAL
  Filled 2019-06-05 (×4): qty 1

## 2019-06-05 MED ORDER — FUROSEMIDE 10 MG/ML IJ SOLN
20.0000 mg | Freq: Once | INTRAMUSCULAR | Status: AC
  Administered 2019-06-05: 21:00:00 20 mg via INTRAVENOUS
  Filled 2019-06-05: qty 2

## 2019-06-05 MED ORDER — INSULIN ASPART 100 UNIT/ML ~~LOC~~ SOLN
0.0000 [IU] | Freq: Four times a day (QID) | SUBCUTANEOUS | Status: DC
  Administered 2019-06-05 – 2019-06-06 (×2): 1 [IU] via SUBCUTANEOUS
  Filled 2019-06-05 (×2): qty 1

## 2019-06-05 MED ORDER — HALOPERIDOL LACTATE 5 MG/ML IJ SOLN
2.0000 mg | Freq: Once | INTRAMUSCULAR | Status: AC
  Administered 2019-06-05: 2 mg via INTRAVENOUS
  Filled 2019-06-05: qty 1

## 2019-06-05 MED ORDER — ONDANSETRON HCL 4 MG PO TABS: 4.0000 mg | ORAL_TABLET | Freq: Four times a day (QID) | ORAL | Status: DC | PRN

## 2019-06-05 MED ORDER — APIXABAN 5 MG PO TABS
5.0000 mg | ORAL_TABLET | Freq: Two times a day (BID) | ORAL | Status: DC
  Administered 2019-06-06 – 2019-06-08 (×4): 5 mg via ORAL
  Filled 2019-06-05 (×5): qty 1

## 2019-06-05 MED ORDER — ONDANSETRON HCL 4 MG/2ML IJ SOLN: 4.0000 mg | Freq: Four times a day (QID) | INTRAMUSCULAR | Status: DC | PRN

## 2019-06-05 MED ORDER — MELATONIN 5 MG PO TABS
2.5000 mg | ORAL_TABLET | Freq: Every day | ORAL | Status: DC
  Administered 2019-06-06 – 2019-06-07 (×2): 2.5 mg via ORAL
  Filled 2019-06-05 (×5): qty 0.5

## 2019-06-05 MED ORDER — TAMSULOSIN HCL 0.4 MG PO CAPS
0.4000 mg | ORAL_CAPSULE | Freq: Every day | ORAL | Status: DC
  Administered 2019-06-06 – 2019-06-07 (×2): 0.4 mg via ORAL
  Filled 2019-06-05 (×3): qty 1

## 2019-06-05 MED ORDER — LATANOPROST 0.005 % OP SOLN
1.0000 [drp] | Freq: Every day | OPHTHALMIC | Status: DC
  Administered 2019-06-06 – 2019-06-07 (×2): 1 [drp] via OPHTHALMIC
  Filled 2019-06-05: qty 2.5

## 2019-06-05 MED ORDER — FUROSEMIDE 40 MG PO TABS
40.0000 mg | ORAL_TABLET | Freq: Every day | ORAL | Status: DC
  Administered 2019-06-05 – 2019-06-08 (×4): 40 mg via ORAL
  Filled 2019-06-05 (×4): qty 1

## 2019-06-05 MED ORDER — ACETAMINOPHEN 650 MG RE SUPP: 650.0000 mg | Freq: Four times a day (QID) | RECTAL | Status: DC | PRN

## 2019-06-05 MED ORDER — DORZOLAMIDE HCL 2 % OP SOLN
1.0000 [drp] | Freq: Three times a day (TID) | OPHTHALMIC | Status: DC
  Administered 2019-06-05 – 2019-06-08 (×9): 1 [drp] via OPHTHALMIC
  Filled 2019-06-05: qty 10

## 2019-06-05 NOTE — Progress Notes (Addendum)
[facility] NAME: Guy Castillo    MR#:  [id number]  DATE OF BIRTH:  1940-10-15  SUBJECTIVE:  CHIEF COMPLAINT:   Chief Complaint  Patient presents with  . Shortness of Breath   The patient still has shortness of breath, on oxygen 3 L. REVIEW OF SYSTEMS:  Review of Systems  Constitutional: Positive for malaise/fatigue. Negative for chills and fever.  HENT: Negative for sore throat.   Eyes: Negative for blurred vision and double vision.  Respiratory: Positive for shortness of breath. Negative for cough, hemoptysis, sputum production, wheezing and stridor.   Cardiovascular: Negative for chest pain, palpitations, orthopnea and leg swelling.  Gastrointestinal: Negative for abdominal pain, blood in stool, diarrhea, melena, nausea and vomiting.  Genitourinary: Negative for dysuria, flank pain and hematuria.  Musculoskeletal: Negative for back pain and joint pain.  Neurological: Negative for dizziness, sensory change, focal weakness, seizures, loss of consciousness, weakness and headaches.  Endo/Heme/Allergies: Negative for polydipsia.  Psychiatric/Behavioral: Negative for depression. The patient is not nervous/anxious.     DRUG ALLERGIES:   Allergies  Allergen Reactions  . Tape Other (See Comments)   VITALS:  Blood pressure 112/84, pulse 96, temperature 97.6 F (36.4 C), resp. rate 20, height 6' (1.829 m), weight 99.3 kg, SpO2 98 %. PHYSICAL EXAMINATION:  Physical Exam Constitutional:      General: He is not in acute distress. HENT:     Head: Normocephalic.     Mouth/Throat:     Mouth: Mucous membranes are moist.  Eyes:     General: No scleral icterus.    Conjunctiva/sclera: Conjunctivae normal.     Pupils: Pupils are equal, round, and reactive to light.  Neck:     Musculoskeletal: Normal range of motion and neck supple.     Vascular: No JVD.     Trachea: No tracheal deviation.  Cardiovascular:     Rate and  Rhythm: Normal rate and regular rhythm.     Heart sounds: Normal heart sounds. No murmur. No gallop.   Pulmonary:     Effort: Pulmonary effort is normal. No respiratory distress.     Breath sounds: Normal breath sounds. No wheezing or rales.     Comments: No breath sounds on the left side. Abdominal:     General: Bowel sounds are normal. There is no distension.     Palpations: Abdomen is soft.     Tenderness: There is no abdominal tenderness. There is no rebound.  Musculoskeletal: Normal range of motion.        General: No tenderness.     Right lower leg: No edema.     Left lower leg: No edema.  Skin:    Coloration: Skin is not jaundiced.     Findings: No erythema or rash.  Neurological:     General: No focal deficit present.     Mental Status: He is alert.     Cranial Nerves: No cranial nerve deficit.     Comments: AAO x2-3.    LABORATORY PANEL:  Male CBC Recent Labs  Lab 06/05/19 0831  WBC 9.3  HGB 12.0*  HCT 39.8  PLT 312   ------------------------------------------------------------------------------------------------------------------ Chemistries  Recent Labs  Lab 06/04/19 1937 06/05/19 0831  NA 140 142  K 3.9 3.4*  CL 106 107  CO2 23 25  GLUCOSE 142* 109*  BUN 25* 23  CREATININE 1.25* 1.00  CALCIUM 8.6* 8.4*  AST 37  --   ALT 23  --  ALKPHOS 80  --   BILITOT 1.2  --    RADIOLOGY:  Ct Chest W Contrast  Result Date: 06/04/2019 CLINICAL DATA:  Characterization of the left pleural effusion EXAM: CT CHEST WITH CONTRAST TECHNIQUE: Multidetector CT imaging of the chest was performed during intravenous contrast administration. CONTRAST:  76mL OMNIPAQUE IOHEXOL 300 MG/ML  SOLN COMPARISON:  Radiograph 06/04/2019 FINDINGS: Cardiovascular: Cardiomegaly with biatrial enlargement. Dual lead pacer device with the battery pack in the left chest wall and leads at the right atrium and cardiac apex. There is cardiomegaly with biatrial enlargement. Atherosclerotic  calcification of the coronary arteries. Calcifications are present on the aortic valve leaflets. Small volume of pericardial fluid. Satisfactory opacification the pulmonary arteries to the lobar level. No central or lobar filling defects are seen. Few foci of gas in the main pulmonary artery are likely related to intravenous access. Atherosclerotic plaque within the normal caliber aorta. Three-vessel arch. No acute luminal abnormality or periaortic stranding. Mediastinum/Nodes: Rightward shift of the mediastinum secondary to the volume positive process in the left hemithorax. No acute tracheal abnormality. Truncation of the left central airways as the inter the atelectatic lung. Thoracic esophagus is unremarkable. Lungs/Pleura: There is a large left pleural effusion with complete atelectatic collapse of the left lung. No abnormal pleural thickening or nodularity is evident. No abnormal hypoenhancement of the atelectatic lung parenchyma. Moderate centrilobular and paraseptal emphysematous changes are present in the right lung with some areas subpleural reticular change and superimposed atelectasis. Bandlike opacities in the bases may reflect subsegmental atelectasis or scarring. No suspicious pulmonary nodules or masses. No consolidation. No pneumothorax. No right effusion. Upper Abdomen: Partial fatty replacement of the pancreas. Fluid attenuation cyst in the upper pole right kidney No acute abnormalities present in the visualized portions of the upper abdomen. Musculoskeletal: Multilevel degenerative changes are present in the imaged portions of the spine. No acute osseous abnormality or suspicious osseous lesion. Several remote healed lower thoracic right lateral rib fractures are noted. IMPRESSION: 1. Large left pleural effusion with complete atelectatic collapse of the left lung. No abnormal pleural thickening or nodularity. No abnormal parenchymal hypoattenuation of the collapsed lung. 2. Centrilobular and  paraseptal emphysema within the remaining aerated right lung. Subpleural reticular changes suggesting least mild pulmonary fibrosis as well. 3. Cardiomegaly with biatrial enlargement. Trace pericardial effusion. 4. Aortic Atherosclerosis (ICD10-I70.0). 5. Coronary atherosclerosis. Electronically Signed   By: Lovena Le M.D.   On: 06/04/2019 22:02   Dg Chest Port 1 View  Result Date: 06/04/2019 CLINICAL DATA:  Left pleural effusion EXAM: PORTABLE CHEST 1 VIEW COMPARISON:  None. FINDINGS: Total opacification of the left hemithorax with leftward shift of the heart mediastinum, in keeping with large pleural effusion. There is mild interstitial opacity and compressive atelectasis of the right lung. Cardiomegaly with left chest multi lead pacer. IMPRESSION: 1. Total opacification of the left hemithorax with leftward shift of the heart mediastinum, in keeping with large pleural effusion. There is mild interstitial opacity and compressive atelectasis of the right lung. 2.  Cardiomegaly with left chest multi lead pacer. Electronically Signed   By: Eddie Candle M.D.   On: 06/04/2019 20:16   ASSESSMENT AND PLAN:   Acute on chronic respiratory failure with hypoxia due to large left pleural effusion, possible related to recent diagnosis of colon cancer. Thoracentesis which will be done this Monday interventional radiologist.    HTN (hypertension) -home dose antihypertensives   COPD (chronic obstructive pulmonary disease) ([place]) -stable, continue home meds   Diabetes ([place]) -sliding  scale insulin   Atrial fibrillation, chronic ([place]) -continue home meds, including anticoagulation Hypokalemia.  Potassium supplement. All the records are reviewed and case discussed with Care Management/Social Worker. Management plans discussed with the patient, his wife and daughter and they are in agreement.  CODE STATUS: DNR  TOTAL TIME TAKING CARE OF THIS PATIENT: 40 minutes.   More than 50% of the time was spent in  counseling/coordination of care: YES  POSSIBLE D/C IN 3 DAYS, DEPENDING ON CLINICAL CONDITION.   Demetrios Loll M.D on 06/05/2019 at 2:11 PM  Between 7am to 6pm - Pager - [phone]  After 6pm go to [email or web address] - [facility]

## 2019-06-05 NOTE — Progress Notes (Signed)
Advanced Care Plan.  Purpose of Encounter: CODE STATUS. Parties in Attendance: The patient and me. Patient's Decisional Capacity: Yes. Medical Story: Guy Castillo  is a 78 y.o. male with history of A. fib, CHF, COPD, hypertension, diabetes and recent diagnosis of colon cancer.  The patient is admitted for acute on chronic respiratory failure due to large left pleural effusion.  I discussed with patient about his current condition, prognosis and CODE STATUS.  The patient does not want to be resuscitated or intubated if he has cardiopulmonary arrest. Plan:  Code Status: DNR. Other documents completed: Changed to DNR status, signed DNR paper. Time spent discussing advance care planning: 18 minutes.

## 2019-06-05 NOTE — ED Notes (Signed)
ED TO INPATIENT HANDOFF REPORT  ED Nurse Name and Phone #: Elese Rane [phone]   S Name/Age/Gender Guy Castillo 78 y.o. male Room/Bed: ED17A/ED17A  Code Status   Code Status: Not on file  Home/SNF/Other Nursing Home Patient oriented to: self, place, time and situation Is this baseline? Yes   Triage Complete: Triage complete  Chief Complaint Shortness of Breath  Triage Note Pt presents from [facility] via [facility] with c/o concerning chest x-ray. Nurse practitioner at facility was concerned about chest x-ray. Chest x-ray showing Left plural effusion. NP at facility sent pt here to be checked out. Pt normally chronically on 2L. Pt was 88% on 2L, ems placed pt on 3L. Pt oxygen saturation now 100% on 3L. Pt currently alert and oriented x4 at this time. Pt denies shortness at this time.    Allergies Allergies  Allergen Reactions  . Tape Other (See Comments)    Level of Care/Admitting Diagnosis ED Disposition    ED Disposition Condition [facility] Area: [place] [[id number]]  Level of Care: Telemetry [5]  Covid Evaluation: Confirmed COVID Negative  Diagnosis: Pleural effusion [id number]  Admitting Physician: Lance Coon [id number]  Attending Physician: Lance Coon [phone]  Estimated length of stay: past midnight tomorrow  Certification:: I certify this patient will need inpatient services for at least 2 midnights  Bed request comments: 2a  PT Class (Do Not Modify): Inpatient [101]  PT Acc Code (Do Not Modify): Private [1]       B Medical/Surgery History Past Medical History:  Diagnosis Date  . Atrial fibrillation ([place])   . CHF (congestive heart failure) ([place])   . COPD (chronic obstructive pulmonary disease) ([place])   . Diabetes mellitus without complication ([place])   . History of blood transfusion   . Hypertension   . Pacemaker    Past Surgical History:  Procedure Laterality Date  . CARDIAC SURGERY    . PACEMAKER INSERTION        A IV Location/Drains/Wounds Patient Lines/Drains/Airways Status   Active Line/Drains/Airways    Name:   Placement date:   Placement time:   Site:   Days:   Peripheral IV 06/04/19 Right Forearm   06/04/19    2006    Forearm   1          Intake/Output Last 24 hours  Intake/Output Summary (Last 24 hours) at 06/05/2019 0015 Last data filed at 06/04/2019 2322 Gross per 24 hour  Intake 850 ml  Output -  Net 850 ml    Labs/Imaging Results for orders placed or performed during the hospital encounter of 06/04/19 (from the past 48 hour(s))  Comprehensive metabolic panel     Status: Abnormal   Collection Time: 06/04/19  7:37 PM  Result Value Ref Range   Sodium 140 135 - 145 mmol/L   Potassium 3.9 3.5 - 5.1 mmol/L    Comment: HEMOLYSIS AT THIS LEVEL MAY AFFECT RESULT   Chloride 106 98 - 111 mmol/L   CO2 23 22 - 32 mmol/L   Glucose, Bld 142 (H) 70 - 99 mg/dL   BUN 25 (H) 8 - 23 mg/dL   Creatinine, Ser 1.25 (H) 0.61 - 1.24 mg/dL   Calcium 8.6 (L) 8.9 - 10.3 mg/dL   Total Protein 6.7 6.5 - 8.1 g/dL   Albumin 3.2 (L) 3.5 - 5.0 g/dL   AST 37 15 - 41 U/L    Comment: HEMOLYSIS AT THIS LEVEL MAY AFFECT RESULT   ALT  23 0 - 44 U/L    Comment: HEMOLYSIS AT THIS LEVEL MAY AFFECT RESULT   Alkaline Phosphatase 80 38 - 126 U/L   Total Bilirubin 1.2 0.3 - 1.2 mg/dL    Comment: HEMOLYSIS AT THIS LEVEL MAY AFFECT RESULT   GFR calc non Af Amer 54 (L) >60 mL/min   GFR calc Af Amer >60 >60 mL/min   Anion gap 11 5 - 15    Comment: Performed at [facility], [address] [place], [place] [address]  CBC with Differential     Status: Abnormal   Collection Time: 06/04/19  7:37 PM  Result Value Ref Range   WBC 10.8 (H) 4.0 - 10.5 K/uL   RBC 4.28 4.22 - 5.81 MIL/uL   Hemoglobin 12.4 (L) 13.0 - 17.0 g/dL   HCT 40.7 39.0 - 52.0 %   MCV 95.1 80.0 - 100.0 fL   MCH 29.0 26.0 - 34.0 pg   MCHC 30.5 30.0 - 36.0 g/dL   RDW 16.5 (H) 11.5 - 15.5 %   Platelets 356 150 - 400 K/uL   nRBC 0.0 0.0  - 0.2 %   Neutrophils Relative % 79 %   Neutro Abs 8.6 (H) 1.7 - 7.7 K/uL   Lymphocytes Relative 8 %   Lymphs Abs 0.9 0.7 - 4.0 K/uL   Monocytes Relative 11 %   Monocytes Absolute 1.1 (H) 0.1 - 1.0 K/uL   Eosinophils Relative 1 %   Eosinophils Absolute 0.1 0.0 - 0.5 K/uL   Basophils Relative 1 %   Basophils Absolute 0.1 0.0 - 0.1 K/uL   Immature Granulocytes 0 %   Abs Immature Granulocytes 0.03 0.00 - 0.07 K/uL    Comment: Performed at [facility], [address] [place], [place] [address]  Protime-INR     Status: None   Collection Time: 06/04/19  7:37 PM  Result Value Ref Range   Prothrombin Time 15.0 11.4 - 15.2 seconds   INR 1.2 0.8 - 1.2    Comment: (NOTE) INR goal varies based on device and disease states. Performed at [facility], [address] [place], [place] [address]   APTT     Status: None   Collection Time: 06/04/19  7:37 PM  Result Value Ref Range   aPTT 32 24 - 36 seconds    Comment: Performed at [facility], [address] [place], [place] [address]  Procalcitonin     Status: None   Collection Time: 06/04/19  7:37 PM  Result Value Ref Range   Procalcitonin <0.10 ng/mL    Comment:        Interpretation: PCT (Procalcitonin) <= 0.5 ng/mL: Systemic infection (sepsis) is not likely. Local bacterial infection is possible. (NOTE)       Sepsis PCT Algorithm           Lower Respiratory Tract                                      Infection PCT Algorithm    ----------------------------     ----------------------------         PCT < 0.25 ng/mL                PCT < 0.10 ng/mL         Strongly encourage             Strongly discourage   discontinuation of  antibiotics    initiation of antibiotics    ----------------------------     -----------------------------       PCT 0.25 - 0.50 ng/mL            PCT 0.10 - 0.25 ng/mL               OR       >80% decrease in PCT            Discourage initiation of                                             antibiotics      Encourage discontinuation           of antibiotics    ----------------------------     -----------------------------         PCT >= 0.50 ng/mL              PCT 0.26 - 0.50 ng/mL               AND        <80% decrease in PCT             Encourage initiation of                                             antibiotics       Encourage continuation           of antibiotics    ----------------------------     -----------------------------        PCT >= 0.50 ng/mL                  PCT > 0.50 ng/mL               AND         increase in PCT                  Strongly encourage                                      initiation of antibiotics    Strongly encourage escalation           of antibiotics                                     -----------------------------                                           PCT <= 0.25 ng/mL                                                 OR                                        >  80% decrease in PCT                                     Discontinue / Do not initiate                                             antibiotics Performed at [facility], [address] [place], [place] [address]   SARS Coronavirus 2 [facility] order, Performed in [facility] hospital lab) Nasopharyngeal Nasopharyngeal Swab     Status: None   Collection Time: 06/04/19  7:51 PM   Specimen: Nasopharyngeal Swab  Result Value Ref Range   SARS Coronavirus 2 NEGATIVE NEGATIVE    Comment: (NOTE) If result is NEGATIVE SARS-CoV-2 target nucleic acids are NOT DETECTED. The SARS-CoV-2 RNA is generally detectable in upper and lower  respiratory specimens during the acute phase of infection. The lowest  concentration of SARS-CoV-2 viral copies this assay can detect is 250  copies / mL. A negative result does not preclude SARS-CoV-2 infection  and should not be used as the sole basis for treatment or other  patient management decisions.  A negative result may  occur with  improper specimen collection / handling, submission of specimen other  than nasopharyngeal swab, presence of viral mutation(s) within the  areas targeted by this assay, and inadequate number of viral copies  (<250 copies / mL). A negative result must be combined with clinical  observations, patient history, and epidemiological information. If result is POSITIVE SARS-CoV-2 target nucleic acids are DETECTED. The SARS-CoV-2 RNA is generally detectable in upper and lower  respiratory specimens dur ing the acute phase of infection.  Positive  results are indicative of active infection with SARS-CoV-2.  Clinical  correlation with patient history and other diagnostic information is  necessary to determine patient infection status.  Positive results do  not rule out bacterial infection or co-infection with other viruses. If result is PRESUMPTIVE POSTIVE SARS-CoV-2 nucleic acids MAY BE PRESENT.   A presumptive positive result was obtained on the submitted specimen  and confirmed on repeat testing.  While 2019 novel coronavirus  (SARS-CoV-2) nucleic acids may be present in the submitted sample  additional confirmatory testing may be necessary for epidemiological  and / or clinical management purposes  to differentiate between  SARS-CoV-2 and other Sarbecovirus currently known to infect humans.  If clinically indicated additional testing with an alternate test  methodology [id number]) is advised. The SARS-CoV-2 RNA is generally  detectable in upper and lower respiratory sp ecimens during the acute  phase of infection. The expected result is Negative. Fact Sheet for Patients:  StrictlyIdeas.no Fact Sheet for Healthcare Providers: BankingDealers.co.za This test is not yet approved or cleared by the [place] FDA and has been authorized for detection and/or diagnosis of SARS-CoV-2 by FDA under an Emergency Use Authorization (EUA).  This  EUA will remain in effect (meaning this test can be used) for the duration of the COVID-19 declaration under Section 564(b)(1) of the Act, 21 U.S.C. section 360bbb-3(b)(1), unless the authorization is terminated or revoked sooner. Performed at [facility], [address] [place], [place] [address]   Lactic acid, plasma     Status: Abnormal   Collection Time: 06/04/19  8:33 PM  Result Value Ref Range   Lactic  Acid, Venous 2.0 (HH) 0.5 - 1.9 mmol/L    Comment: CRITICAL RESULT CALLED TO, READ BACK BY AND VERIFIED WITH ANNIE SMITH @2119  06/04/19 MJU Performed at [facility], [address] [place], [place] [address]   Brain natriuretic peptide     Status: Abnormal   Collection Time: 06/04/19  8:43 PM  Result Value Ref Range   B Natriuretic Peptide 225.0 (H) 0.0 - 100.0 pg/mL    Comment: Performed at [facility], [address] [place], [place] [address]  Lactic acid, plasma     Status: None   Collection Time: 06/04/19 11:39 PM  Result Value Ref Range   Lactic Acid, Venous 1.9 0.5 - 1.9 mmol/L    Comment: Performed at [facility], [address] [place], [place] [address]   Ct Chest W Contrast  Result Date: 06/04/2019 CLINICAL DATA:  Characterization of the left pleural effusion EXAM: CT CHEST WITH CONTRAST TECHNIQUE: Multidetector CT imaging of the chest was performed during intravenous contrast administration. CONTRAST:  14mL OMNIPAQUE IOHEXOL 300 MG/ML  SOLN COMPARISON:  Radiograph 06/04/2019 FINDINGS: Cardiovascular: Cardiomegaly with biatrial enlargement. Dual lead pacer device with the battery pack in the left chest wall and leads at the right atrium and cardiac apex. There is cardiomegaly with biatrial enlargement. Atherosclerotic calcification of the coronary arteries. Calcifications are present on the aortic valve leaflets. Small volume of pericardial fluid. Satisfactory opacification the pulmonary arteries to the lobar level. No central  or lobar filling defects are seen. Few foci of gas in the main pulmonary artery are likely related to intravenous access. Atherosclerotic plaque within the normal caliber aorta. Three-vessel arch. No acute luminal abnormality or periaortic stranding. Mediastinum/Nodes: Rightward shift of the mediastinum secondary to the volume positive process in the left hemithorax. No acute tracheal abnormality. Truncation of the left central airways as the inter the atelectatic lung. Thoracic esophagus is unremarkable. Lungs/Pleura: There is a large left pleural effusion with complete atelectatic collapse of the left lung. No abnormal pleural thickening or nodularity is evident. No abnormal hypoenhancement of the atelectatic lung parenchyma. Moderate centrilobular and paraseptal emphysematous changes are present in the right lung with some areas subpleural reticular change and superimposed atelectasis. Bandlike opacities in the bases may reflect subsegmental atelectasis or scarring. No suspicious pulmonary nodules or masses. No consolidation. No pneumothorax. No right effusion. Upper Abdomen: Partial fatty replacement of the pancreas. Fluid attenuation cyst in the upper pole right kidney No acute abnormalities present in the visualized portions of the upper abdomen. Musculoskeletal: Multilevel degenerative changes are present in the imaged portions of the spine. No acute osseous abnormality or suspicious osseous lesion. Several remote healed lower thoracic right lateral rib fractures are noted. IMPRESSION: 1. Large left pleural effusion with complete atelectatic collapse of the left lung. No abnormal pleural thickening or nodularity. No abnormal parenchymal hypoattenuation of the collapsed lung. 2. Centrilobular and paraseptal emphysema within the remaining aerated right lung. Subpleural reticular changes suggesting least mild pulmonary fibrosis as well. 3. Cardiomegaly with biatrial enlargement. Trace pericardial effusion. 4.  Aortic Atherosclerosis (ICD10-I70.0). 5. Coronary atherosclerosis. Electronically Signed   By: Lovena Le M.D.   On: 06/04/2019 22:02   Dg Chest Port 1 View  Result Date: 06/04/2019 CLINICAL DATA:  Left pleural effusion EXAM: PORTABLE CHEST 1 VIEW COMPARISON:  None. FINDINGS: Total opacification of the left hemithorax with leftward shift of the heart mediastinum, in keeping with large pleural effusion. There is mild interstitial opacity and compressive atelectasis of the right lung.  Cardiomegaly with left chest multi lead pacer. IMPRESSION: 1. Total opacification of the left hemithorax with leftward shift of the heart mediastinum, in keeping with large pleural effusion. There is mild interstitial opacity and compressive atelectasis of the right lung. 2.  Cardiomegaly with left chest multi lead pacer. Electronically Signed   By: Eddie Candle M.D.   On: 06/04/2019 20:16    Pending Labs Unresulted Labs (From admission, onward)    Start     Ordered   06/04/19 1942  Culture, blood (routine x 2)  BLOOD CULTURE X 2,   STAT     06/04/19 1941   Signed and Held  Hemoglobin A1c  Once,   R    Comments: To assess prior glycemic control    Signed and Held   Signed and Held  Basic metabolic panel  Tomorrow morning,   R     Signed and Held   Signed and Held  CBC  Tomorrow morning,   R     Signed and Held          Vitals/Pain Today's Vitals   06/04/19 2232 06/04/19 2255 06/04/19 2315 06/04/19 2323  BP:  115/67 103/74   Pulse:  81  (!) 57  Resp:      Temp: 98.7 F (37.1 C)     TempSrc: Oral     SpO2:  98%  95%  Weight:      Height:      PainSc:        Isolation Precautions No active isolations  Medications Medications  cefTRIAXone (ROCEPHIN) 2 g in sodium chloride 0.9 % 100 mL IVPB (0 g Intravenous Stopped 06/04/19 2126)  azithromycin (ZITHROMAX) 500 mg in sodium chloride 0.9 % 250 mL IVPB (0 mg Intravenous Stopped 06/04/19 2322)  acetaminophen (TYLENOL) tablet 1,000 mg (1,000 mg Oral  Given 06/04/19 2004)  sodium chloride 0.9 % bolus 500 mL (0 mLs Intravenous Stopped 06/04/19 2128)  iohexol (OMNIPAQUE) 300 MG/ML solution 75 mL (75 mLs Intravenous Contrast Given 06/04/19 2139)    Mobility non-ambulatory Moderate fall risk   Focused Assessments Cardiac Assessment Handoff:  Cardiac Rhythm: Atrial fibrillation No results found for: CKTOTAL, CKMB, CKMBINDEX, TROPONINI No results found for: DDIMER Does the Patient currently have chest pain? No      R Recommendations: See Admitting Provider Note  Report given to:   Additional Notes:

## 2019-06-05 NOTE — Progress Notes (Signed)
Talked to Dr. Jannifer Franklin about patient's increase confusion tonight. Patient is refusing to take medication, was very suspicious about the medication I was giving him. Patient also is pulling his line, combative and was yelling. Asked if patient can have something to calm him down, haldol IV given. Patient also did not have thoracentesis today, is dyspneic at rest, order to give 20 mg of IV lasix, BP is 109/79. RN will continue to monitor.,

## 2019-06-05 NOTE — Progress Notes (Signed)
PHARMACIST - PHYSICIAN COMMUNICATION  CONCERNING: Antibiotic IV to Oral Route Change Policy  RECOMMENDATION: This patient is receiving azithromycin by the intravenous route.  Based on criteria approved by the Pharmacy and Therapeutics Committee, the antibiotic(s) is/are being converted to the equivalent oral dose form(s).   DESCRIPTION: These criteria include:  Patient being treated for a respiratory tract infection, urinary tract infection, cellulitis or clostridium difficile associated diarrhea if on metronidazole  The patient is not neutropenic and does not exhibit a GI malabsorption state  The patient is eating (either orally or via tube) and/or has been taking other orally administered medications for a least 24 hours  The patient is improving clinically and has a Tmax < 100.5  If you have questions about this conversion, please contact the Pharmacy Department at [phone].   MLS, RPh  

## 2019-06-05 NOTE — Progress Notes (Signed)
   06/05/19 1600  Clinical Encounter Type  Visited With Patient  Visit Type Initial;Spiritual support;Other (Comment)  Referral From Nurse  Spiritual Encounters  Spiritual Needs Prayer;Emotional;Other (Comment)  Stress Factors  Patient Stress Factors Exhausted;Loss;Loss of control;Other (Comment)  Advance Directives (For Healthcare)  Does Patient Have a Medical Advance Directive? No  Does patient want to make changes to medical advance directive? Yes (Inpatient - patient defers changing a medical advance directive at this time - Information given)

## 2019-06-05 NOTE — Plan of Care (Signed)
  Problem: Education: Goal: Knowledge of General Education information will improve Description: Including pain rating scale, medication(s)/side effects and non-pharmacologic comfort measures Outcome: Progressing   Problem: Clinical Measurements: Goal: Respiratory complications will improve Outcome: Progressing   

## 2019-06-05 NOTE — ED Notes (Signed)
April RN, aware of bed assigned   

## 2019-06-06 ENCOUNTER — Inpatient Hospital Stay
Admit: 2019-06-06 | Discharge: 2019-06-06 | Disposition: A | Discharge status: Home / Self Care | Payer: Medicare Other | Attending: Internal Medicine | Admitting: Internal Medicine

## 2019-06-06 LAB — GLUCOSE, CAPILLARY
Glucose-Capillary: 106 mg/dL — ABNORMAL HIGH (ref 70–99)
Glucose-Capillary: 119 mg/dL — ABNORMAL HIGH (ref 70–99)
Glucose-Capillary: 120 mg/dL — ABNORMAL HIGH (ref 70–99)
Glucose-Capillary: 124 mg/dL — ABNORMAL HIGH (ref 70–99)
Glucose-Capillary: 141 mg/dL — ABNORMAL HIGH (ref 70–99)
Glucose-Capillary: 99 mg/dL (ref 70–99)

## 2019-06-06 LAB — ECHOCARDIOGRAM COMPLETE
Height: 72 in
Weight: 3448 oz

## 2019-06-06 MED ORDER — PERFLUTREN LIPID MICROSPHERE
1.0000 mL | INTRAVENOUS | Status: AC | PRN
  Administered 2019-06-06: 11:00:00 4 mL via INTRAVENOUS
  Filled 2019-06-06: qty 10

## 2019-06-06 MED ORDER — INSULIN ASPART 100 UNIT/ML ~~LOC~~ SOLN: 0.0000 [IU] | Freq: Every day | SUBCUTANEOUS | Status: DC

## 2019-06-06 MED ORDER — SODIUM CHLORIDE 0.9% FLUSH: 3.0000 mL | INTRAVENOUS | Status: DC | PRN

## 2019-06-06 MED ORDER — INSULIN ASPART 100 UNIT/ML ~~LOC~~ SOLN
0.0000 [IU] | Freq: Three times a day (TID) | SUBCUTANEOUS | Status: DC
  Administered 2019-06-07: 2 [IU] via SUBCUTANEOUS
  Filled 2019-06-06: qty 1

## 2019-06-06 MED ORDER — SODIUM CHLORIDE 0.9% FLUSH
3.0000 mL | Freq: Two times a day (BID) | INTRAVENOUS | Status: DC
  Administered 2019-06-06 – 2019-06-08 (×4): 3 mL via INTRAVENOUS

## 2019-06-06 MED ORDER — SODIUM CHLORIDE 0.9 % IV SOLN
INTRAVENOUS | Status: DC | PRN
  Administered 2019-06-06: 18:00:00 250 mL via INTRAVENOUS

## 2019-06-06 MED ORDER — INSULIN ASPART 100 UNIT/ML ~~LOC~~ SOLN: 0.0000 [IU] | Freq: Three times a day (TID) | SUBCUTANEOUS | Status: DC

## 2019-06-06 NOTE — Progress Notes (Signed)
[facility] [place] NAME: Guy Castillo    MR#:  [id number]  DATE OF BIRTH:  1941-06-26  SUBJECTIVE:  CHIEF COMPLAINT:   Chief Complaint  Patient presents with  . Shortness of Breath   The patient has better shortness of breath, on oxygen 3 L. REVIEW OF SYSTEMS:  Review of Systems  Constitutional: Positive for malaise/fatigue. Negative for chills and fever.  HENT: Negative for sore throat.   Eyes: Negative for blurred vision and double vision.  Respiratory: Positive for shortness of breath. Negative for cough, hemoptysis, sputum production, wheezing and stridor.   Cardiovascular: Negative for chest pain, palpitations, orthopnea and leg swelling.  Gastrointestinal: Negative for abdominal pain, blood in stool, diarrhea, melena, nausea and vomiting.  Genitourinary: Negative for dysuria, flank pain and hematuria.  Musculoskeletal: Negative for back pain and joint pain.  Neurological: Negative for dizziness, sensory change, focal weakness, seizures, loss of consciousness, weakness and headaches.  Endo/Heme/Allergies: Negative for polydipsia.  Psychiatric/Behavioral: Negative for depression. The patient is not nervous/anxious.     DRUG ALLERGIES:   Allergies  Allergen Reactions  . Tape Other (See Comments)   VITALS:  Blood pressure 139/89, pulse 89, temperature (!) 97.5 F (36.4 C), resp. rate 20, height 6' (1.829 m), weight 97.8 kg, SpO2 100 %. PHYSICAL EXAMINATION:  Physical Exam Constitutional:      General: He is not in acute distress. HENT:     Head: Normocephalic.     Mouth/Throat:     Mouth: Mucous membranes are moist.  Eyes:     General: No scleral icterus.    Conjunctiva/sclera: Conjunctivae normal.     Pupils: Pupils are equal, round, and reactive to light.  Neck:     Musculoskeletal: Normal range of motion and neck supple.     Vascular: No JVD.     Trachea: No tracheal deviation.  Cardiovascular:     Rate and  Rhythm: Normal rate and regular rhythm.     Heart sounds: Normal heart sounds. No murmur. No gallop.   Pulmonary:     Effort: Pulmonary effort is normal. No respiratory distress.     Breath sounds: Normal breath sounds. No wheezing or rales.     Comments: No breath sounds on the left side. Abdominal:     General: Bowel sounds are normal. There is no distension.     Palpations: Abdomen is soft.     Tenderness: There is no abdominal tenderness. There is no rebound.  Musculoskeletal: Normal range of motion.        General: No tenderness.     Right lower leg: No edema.     Left lower leg: No edema.  Skin:    Coloration: Skin is not jaundiced.     Findings: No erythema or rash.  Neurological:     General: No focal deficit present.     Mental Status: He is alert.     Cranial Nerves: No cranial nerve deficit.     Comments: AAO x2-3.    LABORATORY PANEL:  Male CBC Recent Labs  Lab 06/05/19 0831  WBC 9.3  HGB 12.0*  HCT 39.8  PLT 312   ------------------------------------------------------------------------------------------------------------------ Chemistries  Recent Labs  Lab 06/04/19 1937 06/05/19 0831  NA 140 142  K 3.9 3.4*  CL 106 107  CO2 23 25  GLUCOSE 142* 109*  BUN 25* 23  CREATININE 1.25* 1.00  CALCIUM 8.6* 8.4*  AST 37  --   ALT 23  --  ALKPHOS 80  --   BILITOT 1.2  --    RADIOLOGY:  No results found. 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 Thoracentesis will be done tomorrow per interventional radiologist.    HTN (hypertension) -home dose antihypertensives   COPD (chronic obstructive pulmonary disease) ([place]) -stable, continue home meds   Diabetes ([place]) -sliding scale insulin   Atrial fibrillation, chronic ([place]) -continue home meds, including anticoagulation Hypokalemia.  Potassium supplement. All the records are reviewed and  case discussed with Care Management/Social Worker. Management plans discussed with the patient, his wife and daughter and they are in agreement.  CODE STATUS: DNR  TOTAL TIME TAKING CARE OF THIS PATIENT: 28 minutes.   More than 50% of the time was spent in counseling/coordination of care: YES  POSSIBLE D/C IN 3 DAYS, DEPENDING ON CLINICAL CONDITION.   Demetrios Loll M.D on 06/06/2019 at 1:39 PM  Between 7am to 6pm - Pager - [phone]  After 6pm go to [email or web address] - [facility]

## 2019-06-07 ENCOUNTER — Inpatient Hospital Stay: Payer: Medicare Other

## 2019-06-07 LAB — BASIC METABOLIC PANEL
Anion gap: 11 (ref 5–15)
BUN: 24 mg/dL — ABNORMAL HIGH (ref 8–23)
CO2: 24 mmol/L (ref 22–32)
Calcium: 8.8 mg/dL — ABNORMAL LOW (ref 8.9–10.3)
Chloride: 106 mmol/L (ref 98–111)
Creatinine, Ser: 1.01 mg/dL (ref 0.61–1.24)
GFR calc Af Amer: >60 mL/min (ref >60)
GFR calc non Af Amer: >60 mL/min (ref >60)
Glucose, Bld: 149 mg/dL — ABNORMAL HIGH (ref 70–99)
Potassium: 3.5 mmol/L (ref 3.5–5.1)
Sodium: 141 mmol/L (ref 135–145)

## 2019-06-07 LAB — SARS CORONAVIRUS 2 (TAT 6-24 HRS): SARS Coronavirus 2: NEGATIVE

## 2019-06-07 LAB — PROTEIN, PLEURAL OR PERITONEAL FLUID: Total protein, fluid: 3.3 g/dL

## 2019-06-07 LAB — LACTATE DEHYDROGENASE, PLEURAL OR PERITONEAL FLUID: LD, Fluid: 254 U/L — ABNORMAL HIGH (ref 3–23)

## 2019-06-07 LAB — BODY FLUID CELL COUNT WITH DIFFERENTIAL
Eos, Fluid: 3 %
Lymphs, Fluid: 14 %
Monocyte-Macrophage-Serous Fluid: 40 %
Neutrophil Count, Fluid: 43 %
Total Nucleated Cell Count, Fluid: 282 cu mm

## 2019-06-07 LAB — ALBUMIN, PLEURAL OR PERITONEAL FLUID: Albumin, Fluid: 1.8 g/dL

## 2019-06-07 LAB — GLUCOSE, PLEURAL OR PERITONEAL FLUID: Glucose, Fluid: 87 mg/dL

## 2019-06-07 LAB — GLUCOSE, CAPILLARY
Glucose-Capillary: 133 mg/dL — ABNORMAL HIGH (ref 70–99)
Glucose-Capillary: 159 mg/dL — ABNORMAL HIGH (ref 70–99)
Glucose-Capillary: 84 mg/dL (ref 70–99)
Glucose-Capillary: 139 mg/dL — ABNORMAL HIGH (ref 70–99)

## 2019-06-07 LAB — MAGNESIUM: Magnesium: 1.9 mg/dL (ref 1.7–2.4)

## 2019-06-07 MED ORDER — QUETIAPINE FUMARATE 25 MG PO TABS
25.0000 mg | ORAL_TABLET | Freq: Every evening | ORAL | Status: DC | PRN
  Administered 2019-06-07 – 2019-06-08 (×2): 25 mg via ORAL
  Filled 2019-06-07 (×2): qty 1

## 2019-06-07 NOTE — Procedures (Signed)
US Thoracentesis for 3.6 liters of bloody fluid without difficulty   Still mild effusion remaining.   may consider retap as needed  Complications:  None  Blood Loss: none  See dictation in canopy pacs

## 2019-06-07 NOTE — Care Management Important Message (Signed)
Important Message  Patient Details  Name: Guy Castillo MRN: [id number] Date of Birth: November 03, 1940   Medicare Important Message Given:  Yes     Dannette Barbara 06/07/2019, 11:12 AM

## 2019-06-07 NOTE — Plan of Care (Signed)

## 2019-06-07 NOTE — Discharge Summary (Addendum)
[facility] [place] NAME: Guy Castillo    MR#:  [id number]  DATE OF BIRTH:  20-Dec-1940  DATE OF ADMISSION:  06/04/2019   ADMITTING PHYSICIAN: Lance Coon, MD  DATE OF DISCHARGE: 06/07/2019 PRIMARY CARE PHYSICIAN: System, Pcp Not In   ADMISSION DIAGNOSIS:  Pleural effusion [J90] DISCHARGE DIAGNOSIS:  Principal Problem:   Pleural effusion Active Problems:   HTN (hypertension)   COPD (chronic obstructive pulmonary disease) (HCC)   Diabetes (HCC)   Atrial fibrillation, chronic ([place])  SECONDARY DIAGNOSIS:   Past Medical History:  Diagnosis Date  . Atrial fibrillation ([place])   . CHF (congestive heart failure) ([place])   . COPD (chronic obstructive pulmonary disease) ([place])   . Diabetes mellitus without complication ([place])   . History of blood transfusion   . Hypertension   . Pacemaker    HOSPITAL COURSE:  Acute on chronic respiratory failure with hypoxia due to large left pleural effusion, possible related to recent diagnosis of colon cancer. Thoracentesis was done by interventional radiologist today. Withdrawal bloody fluid 3.6 L..  Repeated chest x-ray report Improved aeration on the left is noted although a small effusion remains. Repeat thoracentesis on 10/06 or 10/7 may be helpful.  HTN (hypertension) -home dose antihypertensives COPD (chronic obstructive pulmonary disease) (HCC) -stable, continue home meds Diabetes ([name]) -sliding scale insulin Atrial fibrillation, chronic ([place]) -continue home meds, including anticoagulation Hypokalemia.  Improved with Potassium supplement. The patient's daughter wants patient to be transferred to [facility]. Dr. Tyrell Antonio accept the patient, she said there is bed available in [facility] hospital today. DISCHARGE CONDITIONS:  Guarded, transfer to [facility] today. CONSULTS OBTAINED:   DRUG ALLERGIES:   Allergies  Allergen Reactions  . Tape Other (See Comments)   DISCHARGE MEDICATIONS:    Allergies as of 06/07/2019      Reactions   Tape Other (See Comments)      Medication List    TAKE these medications   acetaminophen 325 MG tablet Commonly known as: TYLENOL Take 650 mg by mouth every 8 (eight) hours as needed for mild pain, moderate pain or fever.   ammonium lactate 12 % lotion Commonly known as: LAC-HYDRIN Apply 1 application topically daily. Apply a moderate amount to legs   atorvastatin 40 MG tablet Commonly known as: LIPITOR Take 40 mg by mouth at bedtime.   dorzolamide 2 % ophthalmic solution Commonly known as: TRUSOPT Place 1 drop into both eyes 3 (three) times daily.   Eliquis 5 MG Tabs tablet Generic drug: apixaban Take 5 mg by mouth every 12 (twelve) hours.   ferrous sulfate 324 MG Tbec Take 324 mg by mouth daily with breakfast.   furosemide 40 MG tablet Commonly known as: LASIX Take 40 mg by mouth daily.   gabapentin 100 MG capsule Commonly known as: NEURONTIN Take 100 mg by mouth 3 (three) times daily.   latanoprost 0.005 % ophthalmic solution Commonly known as: XALATAN Place 1 drop into both eyes at bedtime.   Melatonin 3 MG Tabs Take 3 mg by mouth at bedtime.   metoprolol succinate 50 MG 24 hr tablet Commonly known as: TOPROL-XL Take 150 mg by mouth daily. Take with or immediately following a meal.   omeprazole 20 MG capsule Commonly known as: PRILOSEC Take 20 mg by mouth daily.   Proventil HFA 108 (90 Base) MCG/ACT inhaler Generic drug: albuterol Inhale 2 puffs into the lungs 2 (two) times daily as needed for shortness of breath.  senna-docusate 8.6-50 MG tablet Commonly known as: Senokot-S Take 2 tablets by mouth 2 (two) times daily as needed for mild constipation.   tamsulosin 0.4 MG Caps capsule Commonly known as: FLOMAX Take 0.4 mg by mouth at bedtime.   traZODone 50 MG tablet Commonly known as: DESYREL Take 25 mg by mouth at bedtime as needed for sleep.   urea 20 % cream Commonly known as: CARMOL Apply 1  application topically 2 (two) times daily. Apply a moderate amount to left foot        DISCHARGE INSTRUCTIONS:  See AVS.  If you experience worsening of your admission symptoms, develop shortness of breath, life threatening emergency, suicidal or homicidal thoughts you must seek medical attention immediately by calling 911 or calling your MD immediately  if symptoms less severe.  You Must read complete instructions/literature along with all the possible adverse reactions/side effects for all the Medicines you take and that have been prescribed to you. Take any new Medicines after you have completely understood and accpet all the possible adverse reactions/side effects.   Please note  You were cared for by a hospitalist during your hospital stay. If you have any questions about your discharge medications or the care you received while you were in the hospital after you are discharged, you can call the unit and asked to speak with the hospitalist on call if the hospitalist that took care of you is not available. Once you are discharged, your primary care physician will handle any further medical issues. Please note that NO REFILLS for any discharge medications will be authorized once you are discharged, as it is imperative that you return to your primary care physician (or establish a relationship with a primary care physician if you do not have one) for your aftercare needs so that they can reassess your need for medications and monitor your lab values.    On the day of Discharge:  VITAL SIGNS:  Blood pressure 94/66, pulse (!) 101, temperature 97.6 F (36.4 C), temperature source Oral, resp. rate 20, height 6' (1.829 m), weight 102.5 kg, SpO2 96 %. PHYSICAL EXAMINATION:  GENERAL:  78 y.o.-year-old patient lying in the bed with no acute distress.  EYES: Pupils equal, round, reactive to light and accommodation. No scleral icterus. Extraocular muscles intact.  HEENT: Head atraumatic,  normocephalic.  NECK:  Supple, no jugular venous distention. No thyroid enlargement, no tenderness.  LUNGS: Diminished breath sound on the left side, no wheezing, rales,rhonchi or crepitation. No use of accessory muscles of respiration.  CARDIOVASCULAR: S1, S2 normal. No murmurs, rubs, or gallops.  ABDOMEN: Soft, non-tender, non-distended. Bowel sounds present. No organomegaly or mass.  EXTREMITIES: No pedal edema, cyanosis, or clubbing.  NEUROLOGIC: Cranial nerves II through XII are intact. Muscle strength 4/5 in all extremities. Sensation intact. Gait not checked.  PSYCHIATRIC: The patient is alert and oriented x 2.  SKIN: No obvious rash, lesion, or ulcer.  DATA REVIEW:   CBC Recent Labs  Lab 06/05/19 0831  WBC 9.3  HGB 12.0*  HCT 39.8  PLT 312    Chemistries  Recent Labs  Lab 06/04/19 1937  06/07/19 0707  NA 140   < > 141  K 3.9   < > 3.5  CL 106   < > 106  CO2 23   < > 24  GLUCOSE 142*   < > 149*  BUN 25*   < > 24*  CREATININE 1.25*   < > 1.01  CALCIUM 8.6*   < >  8.8*  MG  --   --  1.9  AST 37  --   --   ALT 23  --   --   ALKPHOS 80  --   --   BILITOT 1.2  --   --    < > = values in this interval not displayed.     Microbiology Results  Results for orders placed or performed during the hospital encounter of 06/04/19  SARS Coronavirus 2 [facility] order, Performed in [facility] hospital lab) Nasopharyngeal Nasopharyngeal Swab     Status: None   Collection Time: 06/04/19  7:51 PM   Specimen: Nasopharyngeal Swab  Result Value Ref Range Status   SARS Coronavirus 2 NEGATIVE NEGATIVE Final    Comment: (NOTE) If result is NEGATIVE SARS-CoV-2 target nucleic acids are NOT DETECTED. The SARS-CoV-2 RNA is generally detectable in upper and lower  respiratory specimens during the acute phase of infection. The lowest  concentration of SARS-CoV-2 viral copies this assay can detect is 250  copies / mL. A negative result does not preclude SARS-CoV-2 infection  and should  not be used as the sole basis for treatment or other  patient management decisions.  A negative result may occur with  improper specimen collection / handling, submission of specimen other  than nasopharyngeal swab, presence of viral mutation(s) within the  areas targeted by this assay, and inadequate number of viral copies  (<250 copies / mL). A negative result must be combined with clinical  observations, patient history, and epidemiological information. If result is POSITIVE SARS-CoV-2 target nucleic acids are DETECTED. The SARS-CoV-2 RNA is generally detectable in upper and lower  respiratory specimens dur ing the acute phase of infection.  Positive  results are indicative of active infection with SARS-CoV-2.  Clinical  correlation with patient history and other diagnostic information is  necessary to determine patient infection status.  Positive results do  not rule out bacterial infection or co-infection with other viruses. If result is PRESUMPTIVE POSTIVE SARS-CoV-2 nucleic acids MAY BE PRESENT.   A presumptive positive result was obtained on the submitted specimen  and confirmed on repeat testing.  While 2019 novel coronavirus  (SARS-CoV-2) nucleic acids may be present in the submitted sample  additional confirmatory testing may be necessary for epidemiological  and / or clinical management purposes  to differentiate between  SARS-CoV-2 and other Sarbecovirus currently known to infect humans.  If clinically indicated additional testing with an alternate test  methodology [id number]) is advised. The SARS-CoV-2 RNA is generally  detectable in upper and lower respiratory sp ecimens during the acute  phase of infection. The expected result is Negative. Fact Sheet for Patients:  StrictlyIdeas.no Fact Sheet for Healthcare Providers: BankingDealers.co.za This test is not yet approved or cleared by the [place] FDA and has been  authorized for detection and/or diagnosis of SARS-CoV-2 by FDA under an Emergency Use Authorization (EUA).  This EUA will remain in effect (meaning this test can be used) for the duration of the COVID-19 declaration under Section 564(b)(1) of the Act, 21 U.S.C. section 360bbb-3(b)(1), unless the authorization is terminated or revoked sooner. Performed at [facility], [address] [place], [place] [address]   Culture, blood (routine x 2)     Status: None (Preliminary result)   Collection Time: 06/04/19  8:42 PM   Specimen: BLOOD  Result Value Ref Range Status   Specimen Description BLOOD LEFT ANTECUBITAL  Final   Special Requests   Final  BOTTLES DRAWN AEROBIC AND ANAEROBIC Blood Culture adequate volume   Culture   Final    NO GROWTH 3 DAYS Performed at [facility], [address] [place], [place] [address]    Report Status PENDING  Incomplete  Culture, blood (routine x 2)     Status: None (Preliminary result)   Collection Time: 06/04/19  8:48 PM   Specimen: BLOOD  Result Value Ref Range Status   Specimen Description BLOOD LEFT HAND  Final   Special Requests   Final    BOTTLES DRAWN AEROBIC AND ANAEROBIC Blood Culture adequate volume   Culture   Final    NO GROWTH 3 DAYS Performed at [facility], [address] [place], [place] [address]    Report Status PENDING  Incomplete  MRSA PCR Screening     Status: None   Collection Time: 06/05/19  1:25 AM   Specimen: Nasopharyngeal  Result Value Ref Range Status   MRSA by PCR NEGATIVE NEGATIVE Final    Comment:        The GeneXpert MRSA Assay (FDA approved for NASAL specimens only), is one component of a comprehensive MRSA colonization surveillance program. It is not intended to diagnose MRSA infection nor to guide or monitor treatment for MRSA infections. Performed at [facility], [address] [place], [place] [address]     RADIOLOGY:  Dg Chest 1 View  Result Date:  06/07/2019 CLINICAL DATA:  Status post left thoracentesis EXAM: CHEST  1 VIEW COMPARISON:  06/04/2019 FINDINGS: Cardiac shadow remains enlarged. Pacing device is again seen. The right lung is hypoinflated. Increased aeration in the left lung is noted when compared with the prior exam following thoracentesis. There remains a small pleural effusion on the left. Repeat thoracentesis tomorrow or the next day may be considered. No bony abnormality is seen. IMPRESSION: No pneumothorax is noted following thoracentesis. Improved aeration on the left is noted although a small effusion remains. Repeat thoracentesis on 10/06 or 10/7 may be helpful. Electronically Signed   By: Inez Catalina M.D.   On: 06/07/2019 11:05   US Thoracentesis Asp Pleural Space W/img Guide  Result Date: 06/07/2019 INDICATION: Left hemithorax opacification EXAM: ULTRASOUND GUIDED LEFT THORACENTESIS MEDICATIONS: None. COMPLICATIONS: None immediate. PROCEDURE: An ultrasound guided thoracentesis was thoroughly discussed with the patient's daughter and questions answered. The benefits, risks, alternatives and complications were also discussed. The patient's daughter understands and wishes to proceed with the procedure. Written consent was obtained. Ultrasound was performed to localize and mark an adequate pocket of fluid in the left chest. The area was then prepped and draped in the normal sterile fashion. 1% Lidocaine was used for local anesthesia. Under ultrasound guidance a 6 Fr Safe-T-Centesis catheter was introduced. Thoracentesis was performed. The catheter was removed and a dressing applied. FINDINGS: A total of approximately 3.6 L of bloody fluid was removed. Samples were sent to the laboratory as requested by the clinical team. Mild effusion remains at the completion of the procedure due to a desire to not over stress the patient's cardiac system. IMPRESSION: Successful ultrasound guided left thoracentesis yielding 3.6 L of bloody pleural  fluid. Small residual effusion is noted. Repeat thoracentesis could be performed on 10/06 or 10/7 as indicated. Electronically Signed   By: Inez Catalina M.D.   On: 06/07/2019 11:10     Management plans discussed with the patient, her daughter and they are in agreement.  CODE STATUS: DNR   TOTAL TIME TAKING CARE OF THIS PATIENT: 42  minutes.    Demetrios Loll M.D on 06/07/2019 at 1:18 PM  Between 7am to 6pm - Pager - [phone]  After 6pm go to [email or web address] - Proofreader  [facility]  Office  [phone]  CC: Primary care physician; System, Pcp Not In   Note: This dictation was prepared with Dragon dictation along with smaller phrase technology. Any transcriptional errors that result from this process are unintentional.

## 2019-06-07 NOTE — Progress Notes (Signed)
Per CCMD pt had 20 beat run of v.tach on tele- asymptomatic / MD made aware

## 2019-06-07 NOTE — TOC Progression Note (Addendum)
Transition of Care [facility]) - Progression Note    Patient Details  Name: Guy Castillo MRN: [id number] Date of Birth: [birth year]  Transition of Care [facility]) CM/SW Contact  Elza Rafter, RN Phone Number: 06/07/2019, 2:31 PM  Clinical Narrative:   Order for patient transfer to the [place].  Dr. Dessa Phi with Dr. Tyrell Antonio at the [facility] and she states they can accept patient today.  [facility]ed all [facility] forms and clinicals to [phone].  Spoke with Hassan Rowan at the [facility] transfer center.  Patient will transfer via [place] when bed available.    Hassan Rowan with the [place] will call this RN CM or the floor after hours if able to transfer this evening.  Expected Discharge Plan: Acute to Acute Transfer Barriers to Discharge: Other (comment)(waiting on bed from [place])  Expected Discharge Plan and Services Expected Discharge Plan: Acute to Acute Transfer   Discharge Planning Services: CM Consult   Living arrangements for the past 2 months: [place] Expected Discharge Date: 06/07/19                                     Social Determinants of Health (SDOH) Interventions    Readmission Risk Interventions No flowsheet data found.

## 2019-06-08 LAB — CBC
HCT: 40 % (ref 39.0–52.0)
Hemoglobin: 11.8 g/dL — ABNORMAL LOW (ref 13.0–17.0)
MCH: 28.2 pg (ref 26.0–34.0)
MCHC: 29.5 g/dL — ABNORMAL LOW (ref 30.0–36.0)
MCV: 95.7 fL (ref 80.0–100.0)
Platelets: 236 10*3/uL (ref 150–400)
RBC: 4.18 MIL/uL — ABNORMAL LOW (ref 4.22–5.81)
RDW: 16.4 % — ABNORMAL HIGH (ref 11.5–15.5)
WBC: 9.1 10*3/uL (ref 4.0–10.5)
nRBC: 0 % (ref 0.0–0.2)

## 2019-06-08 LAB — GLUCOSE, CAPILLARY
Glucose-Capillary: 115 mg/dL — ABNORMAL HIGH (ref 70–99)
Glucose-Capillary: 111 mg/dL — ABNORMAL HIGH (ref 70–99)

## 2019-06-08 LAB — CYTOLOGY - NON PAP

## 2019-06-08 NOTE — TOC Progression Note (Signed)
Transition of Care [facility]) - Progression Note    Patient Details  Name: Guy Castillo MRN: [id number] Date of Birth: [birth year]  Transition of Care [facility]) CM/SW Contact  Elza Rafter, RN Phone Number: 06/08/2019, 8:40 AM  Clinical Narrative:   RN CM was notified at 16:15 that the [facility] fax machine was down.  Hassan Rowan confirmed with this RN that she received all faxes yesterday afternnoon around 2 PM.  This RN was notified by Marcheta Grammes CM at the [facility] that they did not have my faxes.  Hassan Rowan was already gone for the day at this time which was 16:30.  Spoke with daughter Izora Gala and she had spoken to Melida Quitter told daughter we could e-mail records and they would possibly be able to transfer patient overnight.  This RN CM emailed records at 17:12 on 06-07-19.    This morning 06-08-19 patient is still here at [facility].  Spoke with Hassan Rowan this morning at 0830.  Hassan Rowan again confirmed she had all records yesterday afternoon.  She states the [place] MD was unable to reach Dr. Bridgett Larsson in the evening for discussion of patient.  The [facility] MD will page Dr. Bridgett Larsson this morning.  Sent Dr. Bridgett Larsson a secure chat letting him know to expect a page from the [place].      Expected Discharge Plan: Acute to Acute Transfer Barriers to Discharge: Other (comment)(waiting on bed from [place])  Expected Discharge Plan and Services Expected Discharge Plan: Acute to Acute Transfer   Discharge Planning Services: CM Consult   Living arrangements for the past 2 months: [place] Expected Discharge Date: 06/07/19                                     Social Determinants of Health (SDOH) Interventions    Readmission Risk Interventions No flowsheet data found.

## 2019-06-08 NOTE — TOC Transition Note (Signed)
Transition of Care [facility]) - CM/SW Discharge Note   Patient Details  Name: Zyien Dieken MRN: [id number] Date of Birth: 1941/07/10  [facility] CM/SW Contact:  Elza Rafter, RN Phone Number: 06/08/2019, 10:24 AM   Clinical Narrative:   Damaris Schooner with Hassan Rowan at the transfer center at 10:20.  They are accepting patient.  Patient will go to 7A and our RN can call report to [phone] ext. [phone].  Izora Gala daughter is aware.      Final next level of care: Acute to Acute Transfer Barriers to Discharge: Other (comment)(waiting on bed from [place])   Patient Goals and CMS Choice        Discharge Placement                       Discharge Plan and Services   Discharge Planning Services: CM Consult                                 Social Determinants of Health (SDOH) Interventions     Readmission Risk Interventions No flowsheet data found.

## 2019-06-08 NOTE — Discharge Summary (Signed)
[facility] NAME: Guy Castillo    MR#:  [id number]  DATE OF BIRTH:  [birth year]  DATE OF ADMISSION:  06/04/2019   ADMITTING PHYSICIAN: Lance Coon, MD  DATE OF DISCHARGE: 06/08/2019 PRIMARY CARE PHYSICIAN: System, Pcp Not In   ADMISSION DIAGNOSIS:  Pleural effusion [J90] DISCHARGE DIAGNOSIS:  Principal Problem:   Pleural effusion Active Problems:   HTN (hypertension)   COPD (chronic obstructive pulmonary disease) (HCC)   Diabetes (HCC)   Atrial fibrillation, chronic ([place])  SECONDARY DIAGNOSIS:   Past Medical History:  Diagnosis Date  . Atrial fibrillation ([place])   . CHF (congestive heart failure) ([place])   . COPD (chronic obstructive pulmonary disease) ([place])   . Diabetes mellitus without complication ([place])   . History of blood transfusion   . Hypertension   . Pacemaker    HOSPITAL COURSE:  Acute on chronic respiratory failure with hypoxia due to large left pleural effusion, possible related to recent diagnosis of colon cancer. On 2 L oxygen today. Thoracentesis was done by interventional radiologist yesterday. Withdrawal bloody fluid 3.6 L..  Repeated chest x-ray report Improved aeration on the left is noted although a small effusion remains. Repeat thoracentesis on 10/06 or 10/7 may be helpful.  HTN (hypertension) -home dose antihypertensives COPD (chronic obstructive pulmonary disease) (HCC) -stable, continue home meds Diabetes ([place]) -sliding scale insulin Atrial fibrillation, chronic ([place]) -continue home meds, including anticoagulation Hypokalemia.  Improved with Potassium supplement. The patient's daughter wants patient to be transferred to [facility]. Dr. Tyrell Antonio accept the patient. I discussed with Cyrus on-call physician.  She also accepted patient and the patient should be transferred to [facility] today. DISCHARGE CONDITIONS:  Guarded, transfer to [facility] today. CONSULTS OBTAINED:   DRUG  ALLERGIES:   Allergies  Allergen Reactions  . Tape Other (See Comments)   DISCHARGE MEDICATIONS:   Allergies as of 06/08/2019      Reactions   Tape Other (See Comments)      Medication List    TAKE these medications   acetaminophen 325 MG tablet Commonly known as: TYLENOL Take 650 mg by mouth every 8 (eight) hours as needed for mild pain, moderate pain or fever.   ammonium lactate 12 % lotion Commonly known as: LAC-HYDRIN Apply 1 application topically daily. Apply a moderate amount to legs   atorvastatin 40 MG tablet Commonly known as: LIPITOR Take 40 mg by mouth at bedtime.   dorzolamide 2 % ophthalmic solution Commonly known as: TRUSOPT Place 1 drop into both eyes 3 (three) times daily.   Eliquis 5 MG Tabs tablet Generic drug: apixaban Take 5 mg by mouth every 12 (twelve) hours.   ferrous sulfate 324 MG Tbec Take 324 mg by mouth daily with breakfast.   furosemide 40 MG tablet Commonly known as: LASIX Take 40 mg by mouth daily.   gabapentin 100 MG capsule Commonly known as: NEURONTIN Take 100 mg by mouth 3 (three) times daily.   latanoprost 0.005 % ophthalmic solution Commonly known as: XALATAN Place 1 drop into both eyes at bedtime.   Melatonin 3 MG Tabs Take 3 mg by mouth at bedtime.   metoprolol succinate 50 MG 24 hr tablet Commonly known as: TOPROL-XL Take 150 mg by mouth daily. Take with or immediately following a meal.   omeprazole 20 MG capsule Commonly known as: PRILOSEC Take 20 mg by mouth daily.   Proventil HFA 108 (90 Base) MCG/ACT inhaler Generic drug: albuterol Inhale 2  puffs into the lungs 2 (two) times daily as needed for shortness of breath.   senna-docusate 8.6-50 MG tablet Commonly known as: Senokot-S Take 2 tablets by mouth 2 (two) times daily as needed for mild constipation.   tamsulosin 0.4 MG Caps capsule Commonly known as: FLOMAX Take 0.4 mg by mouth at bedtime.   traZODone 50 MG tablet Commonly known as: DESYREL Take  25 mg by mouth at bedtime as needed for sleep.   urea 20 % cream Commonly known as: CARMOL Apply 1 application topically 2 (two) times daily. Apply a moderate amount to left foot        DISCHARGE INSTRUCTIONS:  See AVS.  If you experience worsening of your admission symptoms, develop shortness of breath, life threatening emergency, suicidal or homicidal thoughts you must seek medical attention immediately by calling 911 or calling your MD immediately  if symptoms less severe.  You Must read complete instructions/literature along with all the possible adverse reactions/side effects for all the Medicines you take and that have been prescribed to you. Take any new Medicines after you have completely understood and accpet all the possible adverse reactions/side effects.   Please note  You were cared for by a hospitalist during your hospital stay. If you have any questions about your discharge medications or the care you received while you were in the hospital after you are discharged, you can call the unit and asked to speak with the hospitalist on call if the hospitalist that took care of you is not available. Once you are discharged, your primary care physician will handle any further medical issues. Please note that NO REFILLS for any discharge medications will be authorized once you are discharged, as it is imperative that you return to your primary care physician (or establish a relationship with a primary care physician if you do not have one) for your aftercare needs so that they can reassess your need for medications and monitor your lab values.    On the day of Discharge:  VITAL SIGNS:  Blood pressure 125/77, pulse 86, temperature 97.7 F (36.5 C), temperature source Oral, resp. rate 19, height 6' (1.829 m), weight 102.5 kg, SpO2 94 %. PHYSICAL EXAMINATION:  GENERAL:  78 y.o.-year-old patient lying in the bed with no acute distress.  EYES: Pupils equal, round, reactive to light and  accommodation. No scleral icterus. Extraocular muscles intact.  HEENT: Head atraumatic, normocephalic.  NECK:  Supple, no jugular venous distention. No thyroid enlargement, no tenderness.  LUNGS: Diminished breath sound on the left side, no wheezing, rales,rhonchi or crepitation. No use of accessory muscles of respiration.  CARDIOVASCULAR: S1, S2 normal. No murmurs, rubs, or gallops.  ABDOMEN: Soft, non-tender, non-distended. Bowel sounds present. No organomegaly or mass.  EXTREMITIES: No pedal edema, cyanosis, or clubbing.  NEUROLOGIC: Cranial nerves II through XII are intact. Muscle strength 4/5 in all extremities. Sensation intact. Gait not checked.  PSYCHIATRIC: The patient is alert and oriented x 2.  SKIN: No obvious rash, lesion, or ulcer.  DATA REVIEW:   CBC Recent Labs  Lab 06/08/19 0434  WBC 9.1  HGB 11.8*  HCT 40.0  PLT 236    Chemistries  Recent Labs  Lab 06/04/19 1937  06/07/19 0707  NA 140   < > 141  K 3.9   < > 3.5  CL 106   < > 106  CO2 23   < > 24  GLUCOSE 142*   < > 149*  BUN 25*   < >  24*  CREATININE 1.25*   < > 1.01  CALCIUM 8.6*   < > 8.8*  MG  --   --  1.9  AST 37  --   --   ALT 23  --   --   ALKPHOS 80  --   --   BILITOT 1.2  --   --    < > = values in this interval not displayed.     Microbiology Results  Results for orders placed or performed during the hospital encounter of 06/04/19  SARS Coronavirus 2 [facility] order, Performed in [facility] hospital lab) Nasopharyngeal Nasopharyngeal Swab     Status: None   Collection Time: 06/04/19  7:51 PM   Specimen: Nasopharyngeal Swab  Result Value Ref Range Status   SARS Coronavirus 2 NEGATIVE NEGATIVE Final    Comment: (NOTE) If result is NEGATIVE SARS-CoV-2 target nucleic acids are NOT DETECTED. The SARS-CoV-2 RNA is generally detectable in upper and lower  respiratory specimens during the acute phase of infection. The lowest  concentration of SARS-CoV-2 viral copies this assay can detect is  250  copies / mL. A negative result does not preclude SARS-CoV-2 infection  and should not be used as the sole basis for treatment or other  patient management decisions.  A negative result may occur with  improper specimen collection / handling, submission of specimen other  than nasopharyngeal swab, presence of viral mutation(s) within the  areas targeted by this assay, and inadequate number of viral copies  (<250 copies / mL). A negative result must be combined with clinical  observations, patient history, and epidemiological information. If result is POSITIVE SARS-CoV-2 target nucleic acids are DETECTED. The SARS-CoV-2 RNA is generally detectable in upper and lower  respiratory specimens dur ing the acute phase of infection.  Positive  results are indicative of active infection with SARS-CoV-2.  Clinical  correlation with patient history and other diagnostic information is  necessary to determine patient infection status.  Positive results do  not rule out bacterial infection or co-infection with other viruses. If result is PRESUMPTIVE POSTIVE SARS-CoV-2 nucleic acids MAY BE PRESENT.   A presumptive positive result was obtained on the submitted specimen  and confirmed on repeat testing.  While 2019 novel coronavirus  (SARS-CoV-2) nucleic acids may be present in the submitted sample  additional confirmatory testing may be necessary for epidemiological  and / or clinical management purposes  to differentiate between  SARS-CoV-2 and other Sarbecovirus currently known to infect humans.  If clinically indicated additional testing with an alternate test  methodology [phone]) is advised. The SARS-CoV-2 RNA is generally  detectable in upper and lower respiratory sp ecimens during the acute  phase of infection. The expected result is Negative. Fact Sheet for Patients:  StrictlyIdeas.no Fact Sheet for Healthcare Providers:  BankingDealers.co.za This test is not yet approved or cleared by the [place] FDA and has been authorized for detection and/or diagnosis of SARS-CoV-2 by FDA under an Emergency Use Authorization (EUA).  This EUA will remain in effect (meaning this test can be used) for the duration of the COVID-19 declaration under Section 564(b)(1) of the Act, 21 U.S.C. section 360bbb-3(b)(1), unless the authorization is terminated or revoked sooner. Performed at [facility], [address] [place], [place] [address]   Culture, blood (routine x 2)     Status: None (Preliminary result)   Collection Time: 06/04/19  8:42 PM   Specimen: BLOOD  Result Value Ref Range Status   Specimen  Description BLOOD LEFT ANTECUBITAL  Final   Special Requests   Final    BOTTLES DRAWN AEROBIC AND ANAEROBIC Blood Culture adequate volume   Culture   Final    NO GROWTH 4 DAYS Performed at [facility], [address], [place], [place] [address]    Report Status PENDING  Incomplete  Culture, blood (routine x 2)     Status: None (Preliminary result)   Collection Time: 06/04/19  8:48 PM   Specimen: BLOOD  Result Value Ref Range Status   Specimen Description BLOOD LEFT HAND  Final   Special Requests   Final    BOTTLES DRAWN AEROBIC AND ANAEROBIC Blood Culture adequate volume   Culture   Final    NO GROWTH 4 DAYS Performed at [facility], [address] [place], [place] [address]    Report Status PENDING  Incomplete  MRSA PCR Screening     Status: None   Collection Time: 06/05/19  1:25 AM   Specimen: Nasopharyngeal  Result Value Ref Range Status   MRSA by PCR NEGATIVE NEGATIVE Final    Comment:        The GeneXpert MRSA Assay (FDA approved for NASAL specimens only), is one component of a comprehensive MRSA colonization surveillance program. It is not intended to diagnose MRSA infection nor to guide or monitor treatment for MRSA infections. Performed  at [facility], [address] [place], [place] [address]   Body fluid culture     Status: None (Preliminary result)   Collection Time: 06/07/19 10:30 AM   Specimen: PATH Cytology Pleural fluid  Result Value Ref Range Status   Specimen Description   Final    PLEURAL Performed at [facility], [address] [place], [place] [address]    Special Requests   Final    NONE Performed at [facility], [address] [place], [place] [address]    Gram Stain   Final    RARE WBC PRESENT, PREDOMINANTLY MONONUCLEAR NO ORGANISMS SEEN Performed at [facility], [place] [address] [place], [place] [address]    Culture PENDING  Incomplete   Report Status PENDING  Incomplete  SARS CORONAVIRUS 2 (TAT 6-24 HRS) Nasopharyngeal Nasopharyngeal Swab     Status: None   Collection Time: 06/07/19  2:22 PM   Specimen: Nasopharyngeal Swab  Result Value Ref Range Status   SARS Coronavirus 2 NEGATIVE NEGATIVE Final    Comment: (NOTE) SARS-CoV-2 target nucleic acids are NOT DETECTED. The SARS-CoV-2 RNA is generally detectable in upper and lower respiratory specimens during the acute phase of infection. Negative results do not preclude SARS-CoV-2 infection, do not rule out co-infections with other pathogens, and should not be used as the sole basis for treatment or other patient management decisions. Negative results must be combined with clinical observations, patient history, and epidemiological information. The expected result is Negative. Fact Sheet for Patients: SugarRoll.be Fact Sheet for Healthcare Providers: https://www.woods-mathews.com/ This test is not yet approved or cleared by the [place] FDA and  has been authorized for detection and/or diagnosis of SARS-CoV-2 by FDA under an Emergency Use Authorization (EUA). This EUA will remain  in effect (meaning this test can be used) for the duration of the COVID-19  declaration under Section 56 4(b)(1) of the Act, 21 U.S.C. section 360bbb-3(b)(1), unless the authorization is terminated or revoked sooner. Performed at [facility], [place] [address] [place], [place] [address]     RADIOLOGY:  Dg Chest  1 View  Result Date: 06/07/2019 CLINICAL DATA:  Status post left thoracentesis EXAM: CHEST  1 VIEW COMPARISON:  06/04/2019 FINDINGS: Cardiac shadow remains enlarged. Pacing device is again seen. The right lung is hypoinflated. Increased aeration in the left lung is noted when compared with the prior exam following thoracentesis. There remains a small pleural effusion on the left. Repeat thoracentesis tomorrow or the next day may be considered. No bony abnormality is seen. IMPRESSION: No pneumothorax is noted following thoracentesis. Improved aeration on the left is noted although a small effusion remains. Repeat thoracentesis on 10/06 or 10/7 may be helpful. Electronically Signed   By: Inez Catalina M.D.   On: 06/07/2019 11:05   US Thoracentesis Asp Pleural Space W/img Guide  Result Date: 06/07/2019 INDICATION: Left hemithorax opacification EXAM: ULTRASOUND GUIDED LEFT THORACENTESIS MEDICATIONS: None. COMPLICATIONS: None immediate. PROCEDURE: An ultrasound guided thoracentesis was thoroughly discussed with the patient's daughter and questions answered. The benefits, risks, alternatives and complications were also discussed. The patient's daughter understands and wishes to proceed with the procedure. Written consent was obtained. Ultrasound was performed to localize and mark an adequate pocket of fluid in the left chest. The area was then prepped and draped in the normal sterile fashion. 1% Lidocaine was used for local anesthesia. Under ultrasound guidance a 6 Fr Safe-T-Centesis catheter was introduced. Thoracentesis was performed. The catheter was removed and a dressing applied. FINDINGS: A total of approximately 3.6 L of bloody fluid was removed. Samples were  sent to the laboratory as requested by the clinical team. Mild effusion remains at the completion of the procedure due to a desire to not over stress the patient's cardiac system. IMPRESSION: Successful ultrasound guided left thoracentesis yielding 3.6 L of bloody pleural fluid. Small residual effusion is noted. Repeat thoracentesis could be performed on 10/06 or 10/7 as indicated. Electronically Signed   By: Inez Catalina M.D.   On: 06/07/2019 11:10     Management plans discussed with the patient, her daughter and they are in agreement.  CODE STATUS: DNR   TOTAL TIME TAKING CARE OF THIS PATIENT: 38 minutes.    Demetrios Loll M.D on 06/08/2019 at 10:51 AM  Between 7am to 6pm - Pager - [phone]  After 6pm go to [email or web address] - Proofreader  [facility]  Office  [phone]  CC: Primary care physician; System, Pcp Not In   Note: This dictation was prepared with Dragon dictation along with smaller phrase technology. Any transcriptional errors that result from this process are unintentional.

## 2019-06-09 ENCOUNTER — Ambulatory Visit (HOSPITAL_COMMUNITY)
Admission: AD | Admit: 2019-06-09 | Discharge: 2019-06-09 | Disposition: A | Discharge status: Home / Self Care | Payer: Medicare Other | Source: Other Acute Inpatient Hospital | Attending: Internal Medicine | Admitting: Internal Medicine

## 2019-06-09 DIAGNOSIS — I10 Essential (primary) hypertension: Secondary | ICD-10-CM | POA: Insufficient documentation

## 2019-06-09 DIAGNOSIS — J90 Pleural effusion, not elsewhere classified: Secondary | ICD-10-CM | POA: Insufficient documentation

## 2019-06-09 DIAGNOSIS — I482 Chronic atrial fibrillation, unspecified: Secondary | ICD-10-CM | POA: Insufficient documentation

## 2019-06-09 LAB — PH, BODY FLUID: pH, Body Fluid: 7.3

## 2019-06-09 LAB — CULTURE, BLOOD (ROUTINE X 2)
Culture: NO GROWTH
Culture: NO GROWTH
Special Requests: ADEQUATE
Special Requests: ADEQUATE

## 2019-06-10 LAB — BODY FLUID CULTURE: Culture: NO GROWTH

## 2019-07-04 ENCOUNTER — Other Ambulatory Visit: Payer: Self-pay

## 2019-07-04 ENCOUNTER — Emergency Department: Payer: Medicare Other

## 2019-07-04 ENCOUNTER — Emergency Department
Admission: EM | Admit: 2019-07-04 | Discharge: 2019-07-05 | Disposition: A | Discharge status: Home / Self Care | Payer: Medicare Other | Attending: Emergency Medicine | Admitting: Emergency Medicine

## 2019-07-04 DIAGNOSIS — Z7901 Long term (current) use of anticoagulants: Secondary | ICD-10-CM | POA: Diagnosis not present

## 2019-07-04 DIAGNOSIS — I110 Hypertensive heart disease with heart failure: Secondary | ICD-10-CM | POA: Insufficient documentation

## 2019-07-04 DIAGNOSIS — Z87891 Personal history of nicotine dependence: Secondary | ICD-10-CM | POA: Diagnosis not present

## 2019-07-04 DIAGNOSIS — R079 Chest pain, unspecified: Secondary | ICD-10-CM | POA: Diagnosis present

## 2019-07-04 DIAGNOSIS — I509 Heart failure, unspecified: Secondary | ICD-10-CM | POA: Diagnosis not present

## 2019-07-04 DIAGNOSIS — Z950 Presence of cardiac pacemaker: Secondary | ICD-10-CM | POA: Diagnosis not present

## 2019-07-04 DIAGNOSIS — J449 Chronic obstructive pulmonary disease, unspecified: Secondary | ICD-10-CM | POA: Insufficient documentation

## 2019-07-04 DIAGNOSIS — Z79899 Other long term (current) drug therapy: Secondary | ICD-10-CM | POA: Diagnosis not present

## 2019-07-04 DIAGNOSIS — E119 Type 2 diabetes mellitus without complications: Secondary | ICD-10-CM | POA: Insufficient documentation

## 2019-07-04 NOTE — ED Provider Notes (Signed)
[facility] Emergency Department Provider Note  ____________________________________________   First MD Initiated Contact with Patient 07/04/19 2315     (approximate)  I have reviewed the triage vital signs and the nursing notes.   HISTORY  Chief Complaint Chest Pain  Level 5 caveat:  history/ROS limited by the patient being a vague historian.  No history specifically of dementia is listed in his medical record.  HPI Guy Castillo is a 78 y.o. male with medical issues as listed below who presents by EMS from [facility] for evaluation of chest pain.  Details are very minimal.  The patient says that he thinks the pain started a couple of hours ago and it was sharp but he is not able to give me any more details except for the pain is completely gone at this time.  He is not able to tell me exactly where it was but according to the paramedics he was reporting of pain from the neck down into his chest.  He has a history of atrial fibrillation and is on Eliquis.  He also has a history of pleural effusion which is thought to be related to his recent diagnosis of colon cancer and he had a thoracentesis  performed in the hospital about a month ago.  He was then sent to the [place] and I have no records from that visit.  He currently has no complaints and is resting comfortably.  He denies shortness of breath but is on 3 L of oxygen which is his baseline.  No reported history of fever.  Denies abdominal pain, nausea, and vomiting.        Past Medical History:  Diagnosis Date  . Atrial fibrillation ([place])   . CHF (congestive heart failure) ([place])   . COPD (chronic obstructive pulmonary disease) ([place])   . Diabetes mellitus without complication ([place])   . History of blood transfusion   . Hypertension   . Pacemaker     Patient Active Problem List   Diagnosis Date Noted  . Pleural effusion 06/04/2019  . HTN (hypertension) 06/04/2019  . COPD (chronic obstructive  pulmonary disease) ([place]) 06/04/2019  . Diabetes ([place]) 06/04/2019  . Atrial fibrillation, chronic ([place]) 06/04/2019    Past Surgical History:  Procedure Laterality Date  . CARDIAC SURGERY    . PACEMAKER INSERTION      Prior to Admission medications   Medication Sig Start Date End Date Taking? Authorizing Provider  acetaminophen (TYLENOL) 325 MG tablet Take 650 mg by mouth every 8 (eight) hours as needed for mild pain, moderate pain or fever.    [provider]  albuterol (PROVENTIL HFA) 108 (90 Base) MCG/ACT inhaler Inhale 2 puffs into the lungs 2 (two) times daily as needed for shortness of breath.     [provider]  ammonium lactate (LAC-HYDRIN) 12 % lotion Apply 1 application topically daily. Apply a moderate amount to legs    [provider]  apixaban (ELIQUIS) 5 MG TABS tablet Take 5 mg by mouth every 12 (twelve) hours.     [provider]  atorvastatin (LIPITOR) 40 MG tablet Take 40 mg by mouth at bedtime.    [provider]  dorzolamide (TRUSOPT) 2 % ophthalmic solution Place 1 drop into both eyes 3 (three) times daily.    [provider]  ferrous sulfate 324 MG TBEC Take 324 mg by mouth daily with breakfast.    [provider]  furosemide (LASIX) 40 MG tablet Take 40  mg by mouth daily.     [provider]  gabapentin (NEURONTIN) 100 MG capsule Take 100 mg by mouth 3 (three) times daily.     [provider]  latanoprost (XALATAN) 0.005 % ophthalmic solution Place 1 drop into both eyes at bedtime.    [provider]  Melatonin 3 MG TABS Take 3 mg by mouth at bedtime.    [provider]  metoprolol succinate (TOPROL-XL) 50 MG 24 hr tablet Take 150 mg by mouth daily. Take with or immediately following a meal.    [provider]  omeprazole (PRILOSEC) 20 MG capsule Take 20 mg by mouth daily.    [provider]  senna-docusate (SENOKOT-S) 8.6-50 MG tablet Take 2 tablets by  mouth 2 (two) times daily as needed for mild constipation.    [provider]  tamsulosin (FLOMAX) 0.4 MG CAPS capsule Take 0.4 mg by mouth at bedtime.    [provider]  traZODone (DESYREL) 50 MG tablet Take 25 mg by mouth at bedtime as needed for sleep.    [provider]  urea (CARMOL) 20 % cream Apply 1 application topically 2 (two) times daily. Apply a moderate amount to left foot    [provider]    Allergies Tape  Family History  Problem Relation Age of Onset  . CAD Mother   . Lung cancer Mother   . CAD Father   . CAD Brother   . Lung cancer Brother     Social History Social History   Tobacco Use  . Smoking status: Former Research scientist (life sciences)  . Smokeless tobacco: Never Used  Substance Use Topics  . Alcohol use: Not Currently  . Drug use: Never    Review of Systems Level 5 caveat:  history/ROS limited by the patient being a vague historian.  No history specifically of dementia is listed in his medical record.  Constitutional: No fever/chills Eyes: No visual changes. ENT: No sore throat. Cardiovascular: +chest pain. Respiratory: Denies shortness of breath. Gastrointestinal: No abdominal pain.  No nausea, no vomiting.  No diarrhea.  No constipation. Genitourinary: Negative for dysuria. Musculoskeletal: Negative for neck pain.  Negative for back pain. Integumentary: Negative for rash. Neurological: Negative for headaches, focal weakness or numbness.   ____________________________________________   PHYSICAL EXAM:  VITAL SIGNS: ED Triage Vitals  Enc Vitals Group     BP 07/04/19 2306 (!) 106/54     Pulse Rate 07/04/19 2306 87     Resp 07/04/19 2306 (!) 25     Temp 07/04/19 2306 98.2 F (36.8 C)     Temp src --      SpO2 07/04/19 2306 97 %     Weight 07/04/19 2307 92.1 kg (203 lb)     Height 07/04/19 2307 1.803 m (5\' 11" )     Head Circumference --      Peak Flow --      Pain Score 07/04/19 2307 0     Pain Loc --      Pain Edu?  --      Excl. in [place]? --     Constitutional: Alert and oriented to self and location.  Appears chronically ill and debilitated but is currently not in distress. Eyes: Conjunctivae are normal.  Head: Atraumatic. Nose: No congestion/rhinnorhea. Mouth/Throat: Patient is not wearing a mask.  His mucous membranes are moist. Neck: No stridor.  No meningeal signs.   Cardiovascular: Irregularly irregular rhythm, initially tachycardic but now normal rate. Good peripheral circulation. Grossly  normal heart sounds. Respiratory: Normal respiratory effort.  No retractions.  Wears 3 L of oxygen by nasal cannula at baseline. Gastrointestinal: Soft and nontender. No distention.  Musculoskeletal: 1+ pitting edema in bilateral lower extremities with chronic skin changes, appears to be chronic edema, no tenderness to palpation. No gross deformities of extremities. Neurologic:  Normal speech and language. No gross focal neurologic deficits are appreciated.  Skin:  Skin is warm, dry and intact.   ____________________________________________   LABS (all labs ordered are listed, but only abnormal results are displayed)  Labs Reviewed  CBC WITH DIFFERENTIAL/PLATELET - Abnormal; Notable for the following components:      Result Value   RBC 3.48 (*)    Hemoglobin 9.5 (*)    HCT 32.2 (*)    MCHC 29.5 (*)    RDW 15.6 (*)    Platelets 678 (*)    Monocytes Absolute 1.3 (*)    All other components within normal limits  COMPREHENSIVE METABOLIC PANEL - Abnormal; Notable for the following components:   Potassium 3.3 (*)    Glucose, Bld 113 (*)    Calcium 8.4 (*)    Albumin 2.4 (*)    All other components within normal limits  LIPASE, BLOOD  TROPONIN I (HIGH SENSITIVITY)  TROPONIN I (HIGH SENSITIVITY)   ____________________________________________  EKG  ED ECG REPORT I, Hinda Kehr, the attending physician, personally viewed and interpreted this ECG.  Date: 07/04/2019 EKG Time: 23: 04 Rate: 115  Rhythm: Atrial fibrillation with borderline RVR QRS Axis: Right axis deviation Intervals: normal other than A. fib, borderline QTC prolongation at 490 ms ST/T Wave abnormalities: Non-specific ST segment / T-wave changes, but no clear evidence of acute ischemia. Narrative Interpretation: no definitive evidence of acute ischemia; does not meet STEMI criteria.   ____________________________________________  RADIOLOGY I, Hinda Kehr, personally viewed and evaluated these images (plain radiographs) as part of my medical decision making, as well as reviewing the written report by the radiologist.  ED MD interpretation:  vascular congestion but without acute abnormalities  Official radiology report(s): Dg Chest Portable 1 View  Result Date: 07/05/2019 CLINICAL DATA:  Chest pain EXAM: PORTABLE CHEST 1 VIEW COMPARISON:  06/07/2019 FINDINGS: Left pacer remains in place, unchanged. Cardiomegaly. Elevation of the left hemidiaphragm with left base atelectasis. Mild vascular congestion. No overt edema or effusions. No acute bony abnormality. IMPRESSION: Cardiomegaly, vascular congestion. Elevation of the left hemidiaphragm.  Left base atelectasis. Electronically Signed   By: Rolm Baptise M.D.   On: 07/05/2019 00:01    ____________________________________________   PROCEDURES   Procedure(s) performed (including Critical Care):  Procedures   ____________________________________________   INITIAL IMPRESSION / MDM / [place] / ED COURSE  As part of my medical decision making, I reviewed the following data within the [facility] notes reviewed and incorporated, Labs reviewed ,  EKG interpreted , Old EKG reviewed, Old chart reviewed, Radiograph reviewed  and Notes from prior ED visits   Differential diagnosis includes, but is not limited to, angina, PE, ACS, musculoskeletal pain, aortic dissection, pain secondary to recurrent pleural effusion, neoplasm.  The  patient is in no distress and is pain-free at this time.  He apparently got to nitroglycerin prior to arrival which he says did not help but he is pain-free and in no distress.  Extensive past medical history but his EKG is generally reassuring.  He has chronic atrial fibrillation.  His Covid test was negative 3 weeks ago.  He is currently  afebrile and his vital signs are stable.  I will check basic labs and reassess.  Anticipate he may be appropriate for 2 troponins and discharge unless he becomes symptomatic again.  I verified with the [facility] paperwork with which he came that he is DNR.      Clinical Course as of Jul 04 556  Mon Jul 05, 2019  0104 Cardiomegaly and vascular congestion with an elevated left hemidiaphragm but no findings that appear acute  DG Chest Portable 1 View [CF]  0108 Will check repeat troponin in two hours, but initial HS-trop is reassuring  Troponin I (High Sensitivity): 10 [CF]  0110 Generally reassuring comprehensive metabolic panel, slightly decreased potassium and I will provide a supplements.  I will also give 2 g of magnesium to assist with the potassium absorption.  CBC is essentially normal.Telemetry caught a 5 beat run of what appears to be V. tach which was asymptomatic.  This could be secondary to the electrolyte abnormality.  Patient did not report any pain during the episode.  Comprehensive metabolic panel(!) [CF]  99991111 Unchanged HS-troponin of 10.  Will d/c back to facility for outpatient follow up.  Troponin I (High Sensitivity): 10 [CF]    Clinical Course User Index [CF] Hinda Kehr, MD     ____________________________________________  FINAL CLINICAL IMPRESSION(S) / ED DIAGNOSES  Final diagnoses:  Chest pain, unspecified type     MEDICATIONS GIVEN DURING THIS VISIT:  Medications  potassium chloride SA (KLOR-CON) CR tablet 40 mEq (40 mEq Oral Given 07/05/19 0117)  magnesium sulfate IVPB 2 g 50 mL (0 g Intravenous Stopped 07/05/19 0320)      ED Discharge Orders    None      *Please note:  Ashwath Chau was evaluated in Emergency Department on 07/05/2019 for the symptoms described in the history of present illness. He was evaluated in the context of the global COVID-19 pandemic, which necessitated consideration that the patient might be at risk for infection with the SARS-CoV-2 virus that causes COVID-19. Institutional protocols and algorithms that pertain to the evaluation of patients at risk for COVID-19 are in a state of rapid change based on information released by regulatory bodies including the CDC and federal and state organizations. These policies and algorithms were followed during the patient's care in the ED.  Some ED evaluations and interventions may be delayed as a result of limited staffing during the pandemic.*  Note:  This document was prepared using Dragon voice recognition software and may include unintentional dictation errors.   Hinda Kehr, MD 07/05/19 [phone]

## 2019-07-04 NOTE — ED Triage Notes (Signed)
Pt from [facility]. C/o chest pain. Given 2 nitro w/o relief. Denies any chest pain at present;

## 2019-07-05 DIAGNOSIS — R079 Chest pain, unspecified: Secondary | ICD-10-CM | POA: Diagnosis not present

## 2019-07-05 LAB — CBC WITH DIFFERENTIAL/PLATELET
Abs Immature Granulocytes: 0.02 10*3/uL (ref 0.00–0.07)
Basophils Absolute: 0.1 10*3/uL (ref 0.0–0.1)
Basophils Relative: 1 %
Eosinophils Absolute: 0.2 10*3/uL (ref 0.0–0.5)
Eosinophils Relative: 2 %
HCT: 32.2 % — ABNORMAL LOW (ref 39.0–52.0)
Hemoglobin: 9.5 g/dL — ABNORMAL LOW (ref 13.0–17.0)
Immature Granulocytes: 0 %
Lymphocytes Relative: 13 %
Lymphs Abs: 1.1 10*3/uL (ref 0.7–4.0)
MCH: 27.3 pg (ref 26.0–34.0)
MCHC: 29.5 g/dL — ABNORMAL LOW (ref 30.0–36.0)
MCV: 92.5 fL (ref 80.0–100.0)
Monocytes Absolute: 1.3 10*3/uL — ABNORMAL HIGH (ref 0.1–1.0)
Monocytes Relative: 15 %
Neutro Abs: 5.9 10*3/uL (ref 1.7–7.7)
Neutrophils Relative %: 69 %
Platelets: 678 10*3/uL — ABNORMAL HIGH (ref 150–400)
RBC: 3.48 MIL/uL — ABNORMAL LOW (ref 4.22–5.81)
RDW: 15.6 % — ABNORMAL HIGH (ref 11.5–15.5)
WBC: 8.5 10*3/uL (ref 4.0–10.5)
nRBC: 0 % (ref 0.0–0.2)

## 2019-07-05 LAB — COMPREHENSIVE METABOLIC PANEL
ALT: 28 U/L (ref 0–44)
AST: 34 U/L (ref 15–41)
Albumin: 2.4 g/dL — ABNORMAL LOW (ref 3.5–5.0)
Alkaline Phosphatase: 80 U/L (ref 38–126)
Anion gap: 9 (ref 5–15)
BUN: 20 mg/dL (ref 8–23)
CO2: 29 mmol/L (ref 22–32)
Calcium: 8.4 mg/dL — ABNORMAL LOW (ref 8.9–10.3)
Chloride: 105 mmol/L (ref 98–111)
Creatinine, Ser: 0.81 mg/dL (ref 0.61–1.24)
GFR calc Af Amer: >60 mL/min (ref >60)
GFR calc non Af Amer: >60 mL/min (ref >60)
Glucose, Bld: 113 mg/dL — ABNORMAL HIGH (ref 70–99)
Potassium: 3.3 mmol/L — ABNORMAL LOW (ref 3.5–5.1)
Sodium: 143 mmol/L (ref 135–145)
Total Bilirubin: 0.4 mg/dL (ref 0.3–1.2)
Total Protein: 6.6 g/dL (ref 6.5–8.1)

## 2019-07-05 LAB — TROPONIN I (HIGH SENSITIVITY)
Troponin I (High Sensitivity): 10 ng/L (ref <18)
Troponin I (High Sensitivity): 10 ng/L (ref <18)

## 2019-07-05 LAB — LIPASE, BLOOD: Lipase: 33 U/L (ref 11–51)

## 2019-07-05 MED ORDER — MAGNESIUM SULFATE 2 GM/50ML IV SOLN
2.0000 g | Freq: Once | INTRAVENOUS | Status: AC
  Administered 2019-07-05: 2 g via INTRAVENOUS
  Filled 2019-07-05: qty 50

## 2019-07-05 MED ORDER — POTASSIUM CHLORIDE CRYS ER 20 MEQ PO TBCR
40.0000 meq | EXTENDED_RELEASE_TABLET | Freq: Once | ORAL | Status: AC
  Administered 2019-07-05: 40 meq via ORAL
  Filled 2019-07-05: qty 2

## 2019-07-05 NOTE — ED Notes (Signed)
Wife called to update  Peripheral IV discontinued. Catheter intact. No signs of infiltration or redness. Gauze applied to IV site.    Discharge instructions reviewed with patient. Questions fielded by this RN. Patient verbalizes understanding of instructions. Patient discharged to SNF in stable condition per Dr Karma Greaser. No acute distress noted at time of discharge.

## 2019-07-05 NOTE — Discharge Instructions (Signed)

## 2019-07-05 NOTE — ED Notes (Signed)
Report to [facility], caregiver at [facility], requests return pt via EMS

## 2019-07-05 NOTE — ED Notes (Signed)
Wife, Harlin Bahri, [phone], called and with pt's permission given update on POC and condition, pt appears oriented and alert

## 2019-10-04 DEATH — deceased

## 2020-08-16 IMAGING — CT CT CHEST W/ CM
2 of 3 series · 14 of 36 positions shown, 17 images · IV contrast (omnipaque)
Comparison: Radiograph 06/04/2019

CLINICAL DATA: Characterization of the left pleural effusion

EXAM:
CT CHEST WITH CONTRAST
TECHNIQUE: Multidetector CT imaging of the chest was performed during
intravenous contrast administration.
CONTRAST:  75mL OMNIPAQUE IOHEXOL 300 MG/ML  SOLN

[Series 2: axial st · axial · 0.78mm/px · z∈[+836,+1118]mm · 11 of 167 slices shown, 14 images]
[im 13/167  mediastinal]
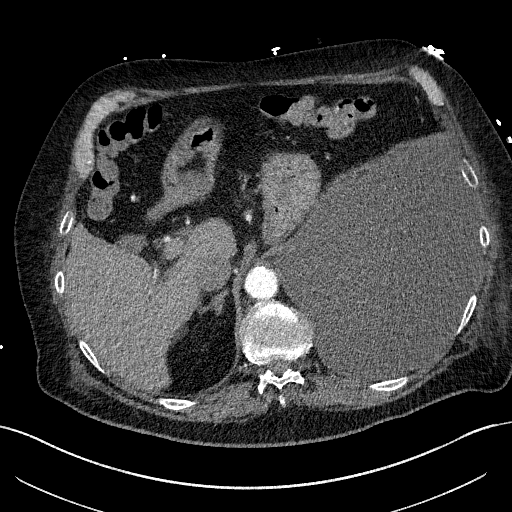
[im 13/167  lung]
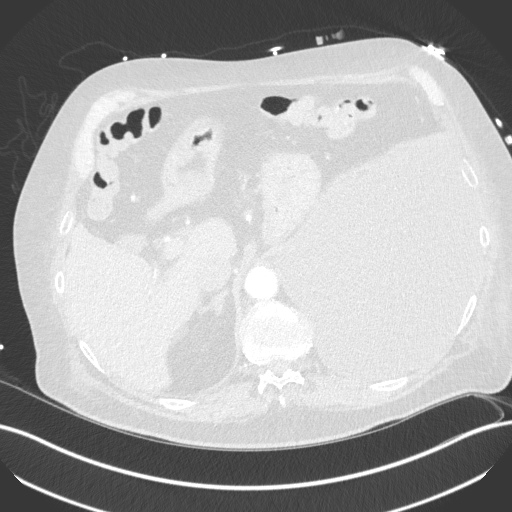
[im 25/167  lung]
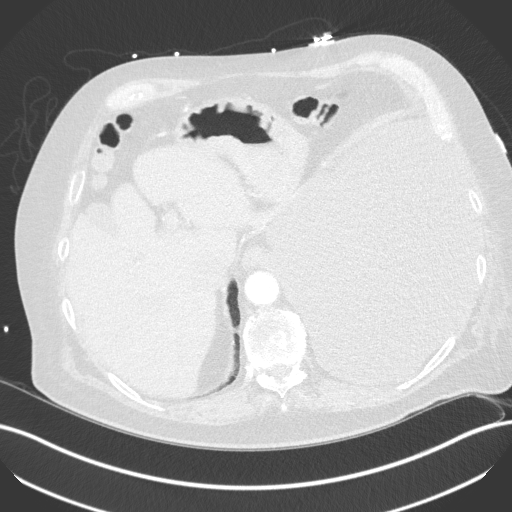
[im 37/167  lung]
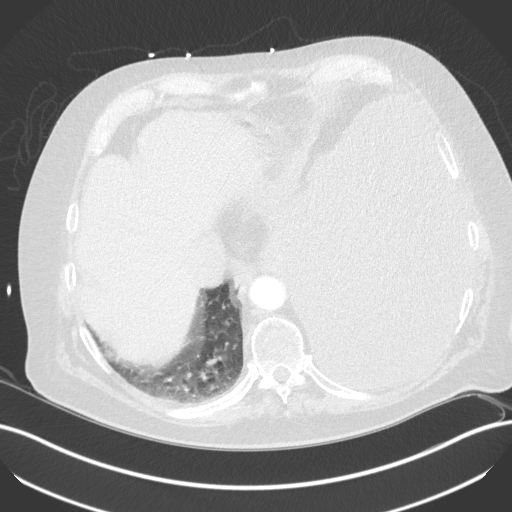
[im 56/167  lung]
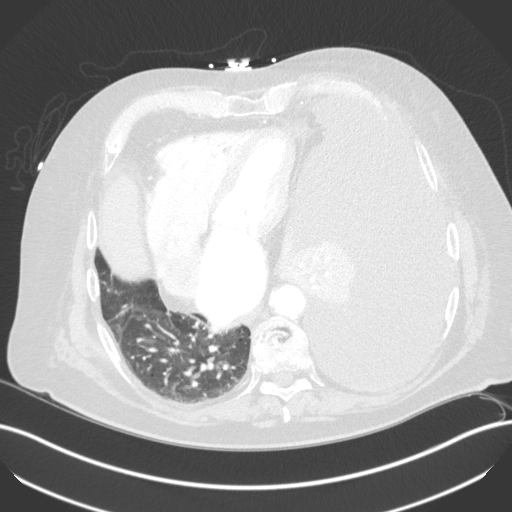
[im 68/167  mediastinal]
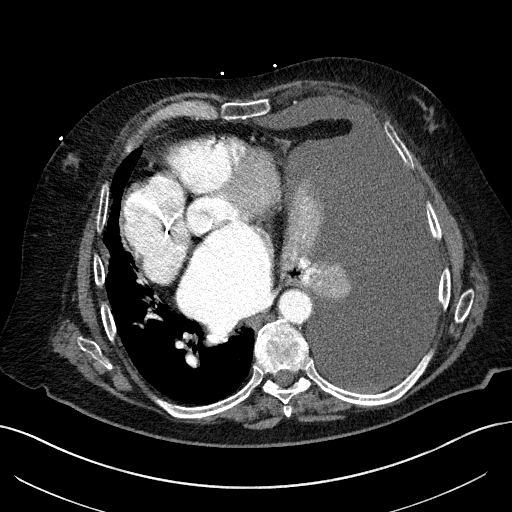
[im 68/167  lung]
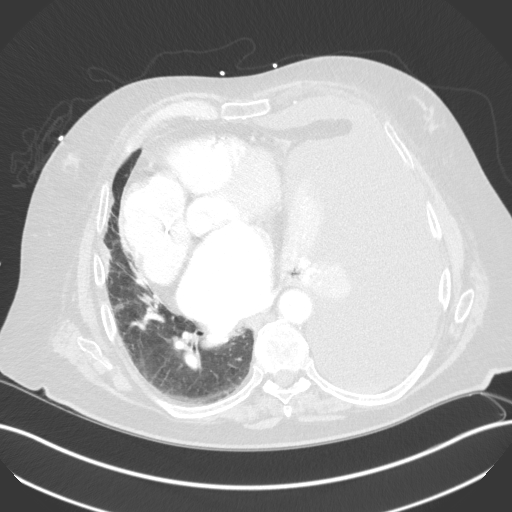
[im 87/167  lung]
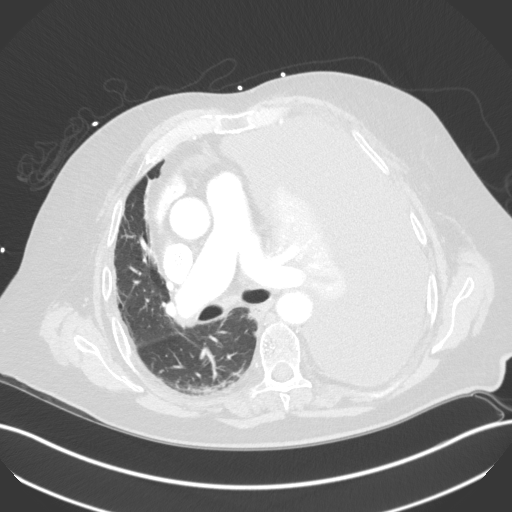
[im 99/167  lung]
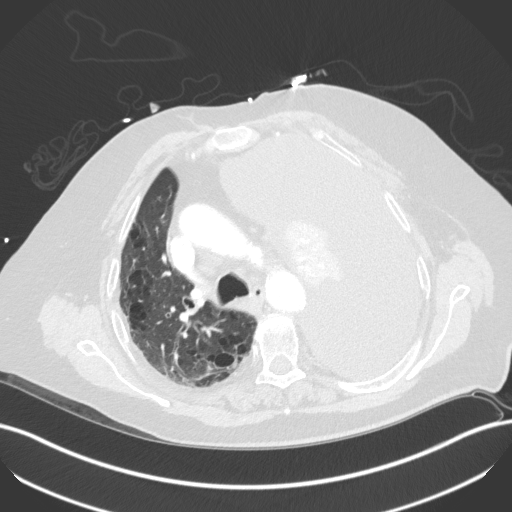
[im 111/167  lung]
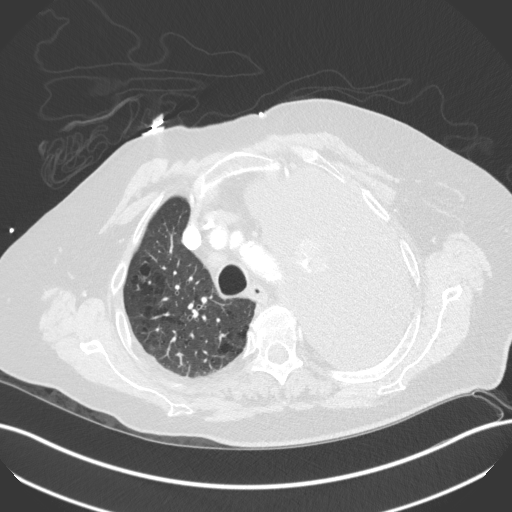
[im 130/167  mediastinal]
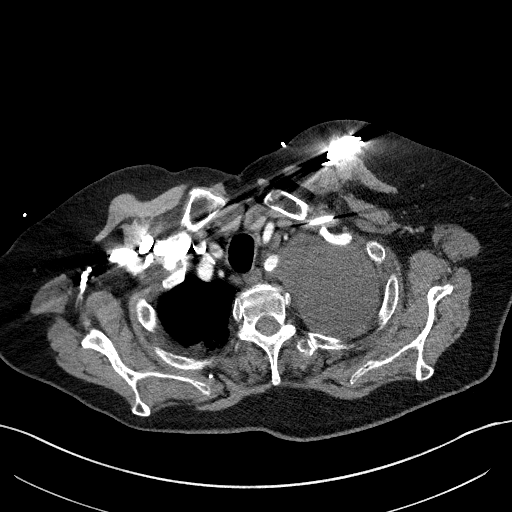
[im 130/167  lung]
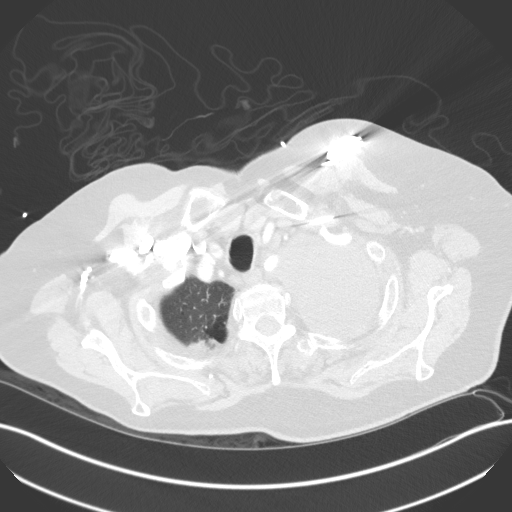
[im 142/167  lung]
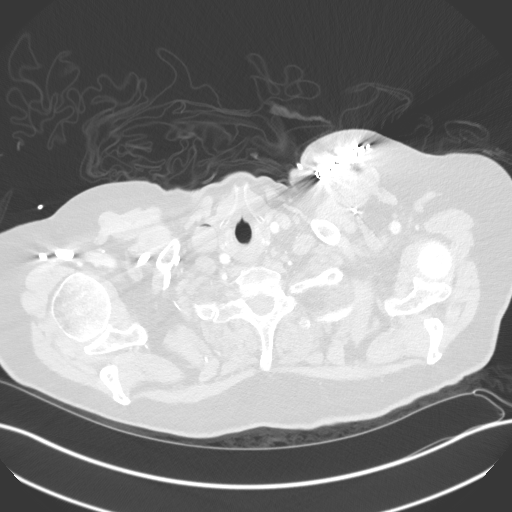
[im 154/167  lung]
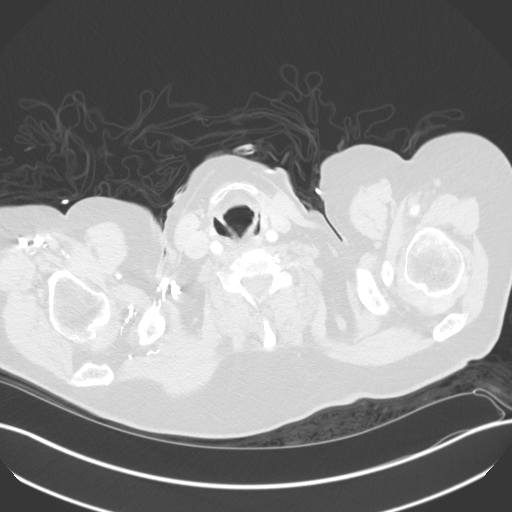

[Series 5: coronal · coronal · 0.62mm/px · 3 of 158 slices shown]
[im 32/158  lung]
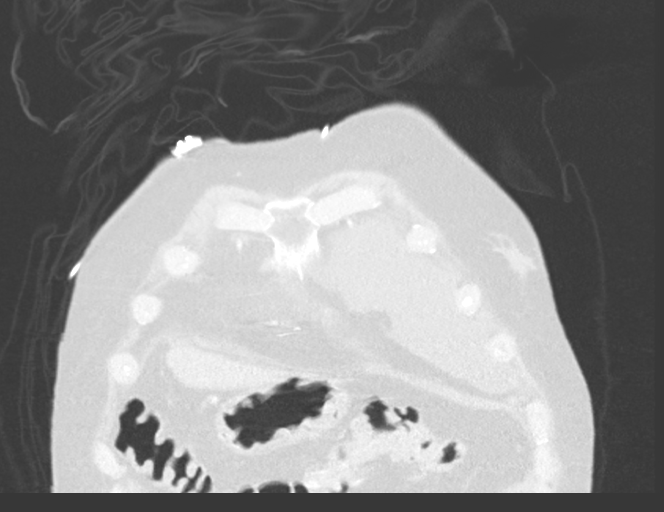
[im 63/158  lung]
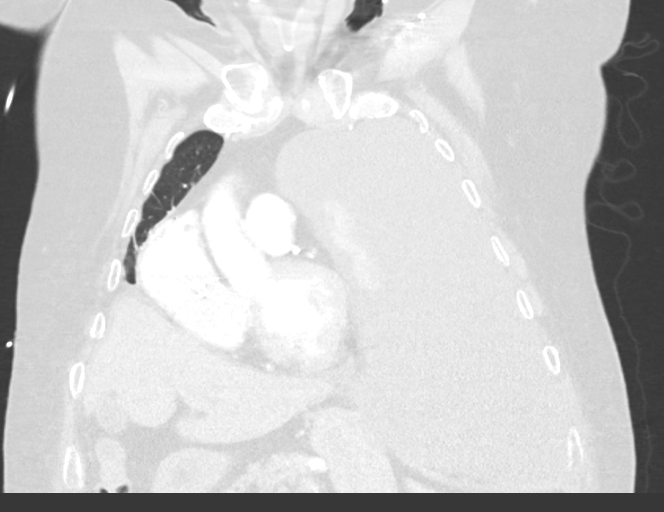
[im 95/158  lung]
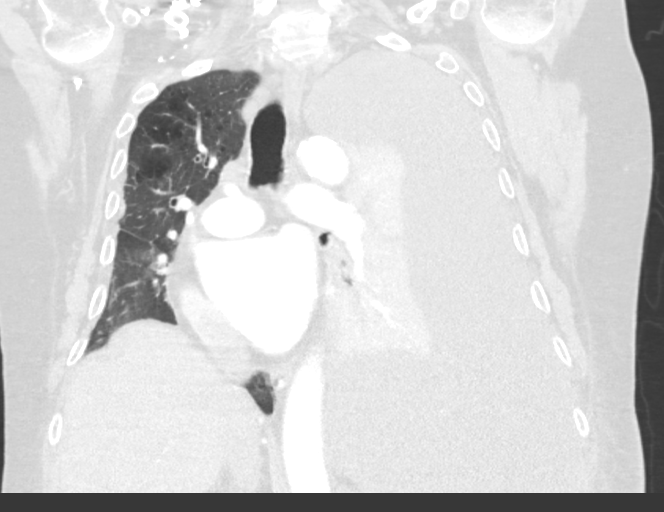

[14 of 36 positions shown; findings below may reference images not displayed]

FINDINGS: Cardiovascular: Cardiomegaly with biatrial enlargement. Dual lead
pacer device with the battery pack in the left chest wall and leads
at the right atrium and cardiac apex. There is cardiomegaly with
biatrial enlargement. Atherosclerotic calcification of the coronary
arteries. Calcifications are present on the aortic valve leaflets.
Small volume of pericardial fluid.

Satisfactory opacification the pulmonary arteries to the lobar
level. No central or lobar filling defects are seen. Few foci of gas
in the main pulmonary artery are likely related to intravenous
access.

Atherosclerotic plaque within the normal caliber aorta. Three-vessel
arch. No acute luminal abnormality or periaortic stranding.

Mediastinum/Nodes: Rightward shift of the mediastinum secondary to
the volume positive process in the left hemithorax. No acute
tracheal abnormality. Truncation of the left central airways as the
inter the atelectatic lung. Thoracic esophagus is unremarkable.

Lungs/Pleura: There is a large left pleural effusion with complete
atelectatic collapse of the left lung. No abnormal pleural
thickening or nodularity is evident. No abnormal hypoenhancement of
the atelectatic lung parenchyma. Moderate centrilobular and
paraseptal emphysematous changes are present in the right lung with
some areas subpleural reticular change and superimposed atelectasis.
Bandlike opacities in the bases may reflect subsegmental atelectasis
or scarring. No suspicious pulmonary nodules or masses. No
consolidation. No pneumothorax. No right effusion.

Upper Abdomen: Partial fatty replacement of the pancreas. Fluid
attenuation cyst in the upper pole right kidney No acute
abnormalities present in the visualized portions of the upper
abdomen.

Musculoskeletal: Multilevel degenerative changes are present in the
imaged portions of the spine. No acute osseous abnormality or
suspicious osseous lesion. Several remote healed lower thoracic
right lateral rib fractures are noted.
IMPRESSION: 1. Large left pleural effusion with complete atelectatic collapse of
the left lung. No abnormal pleural thickening or nodularity. No
abnormal parenchymal hypoattenuation of the collapsed lung.
2. Centrilobular and paraseptal emphysema within the remaining
aerated right lung. Subpleural reticular changes suggesting least
mild pulmonary fibrosis as well.
3. Cardiomegaly with biatrial enlargement. Trace pericardial
effusion.
4. Aortic Atherosclerosis ([id number]).
5. Coronary atherosclerosis.

## 2020-08-19 IMAGING — DX DG CHEST 1V
1 series · 1 of 1 positions shown · non-contrast
Comparison: 06/04/2019

CLINICAL DATA: Status post left thoracentesis

EXAM:
CHEST  1 VIEW

[chest ap]
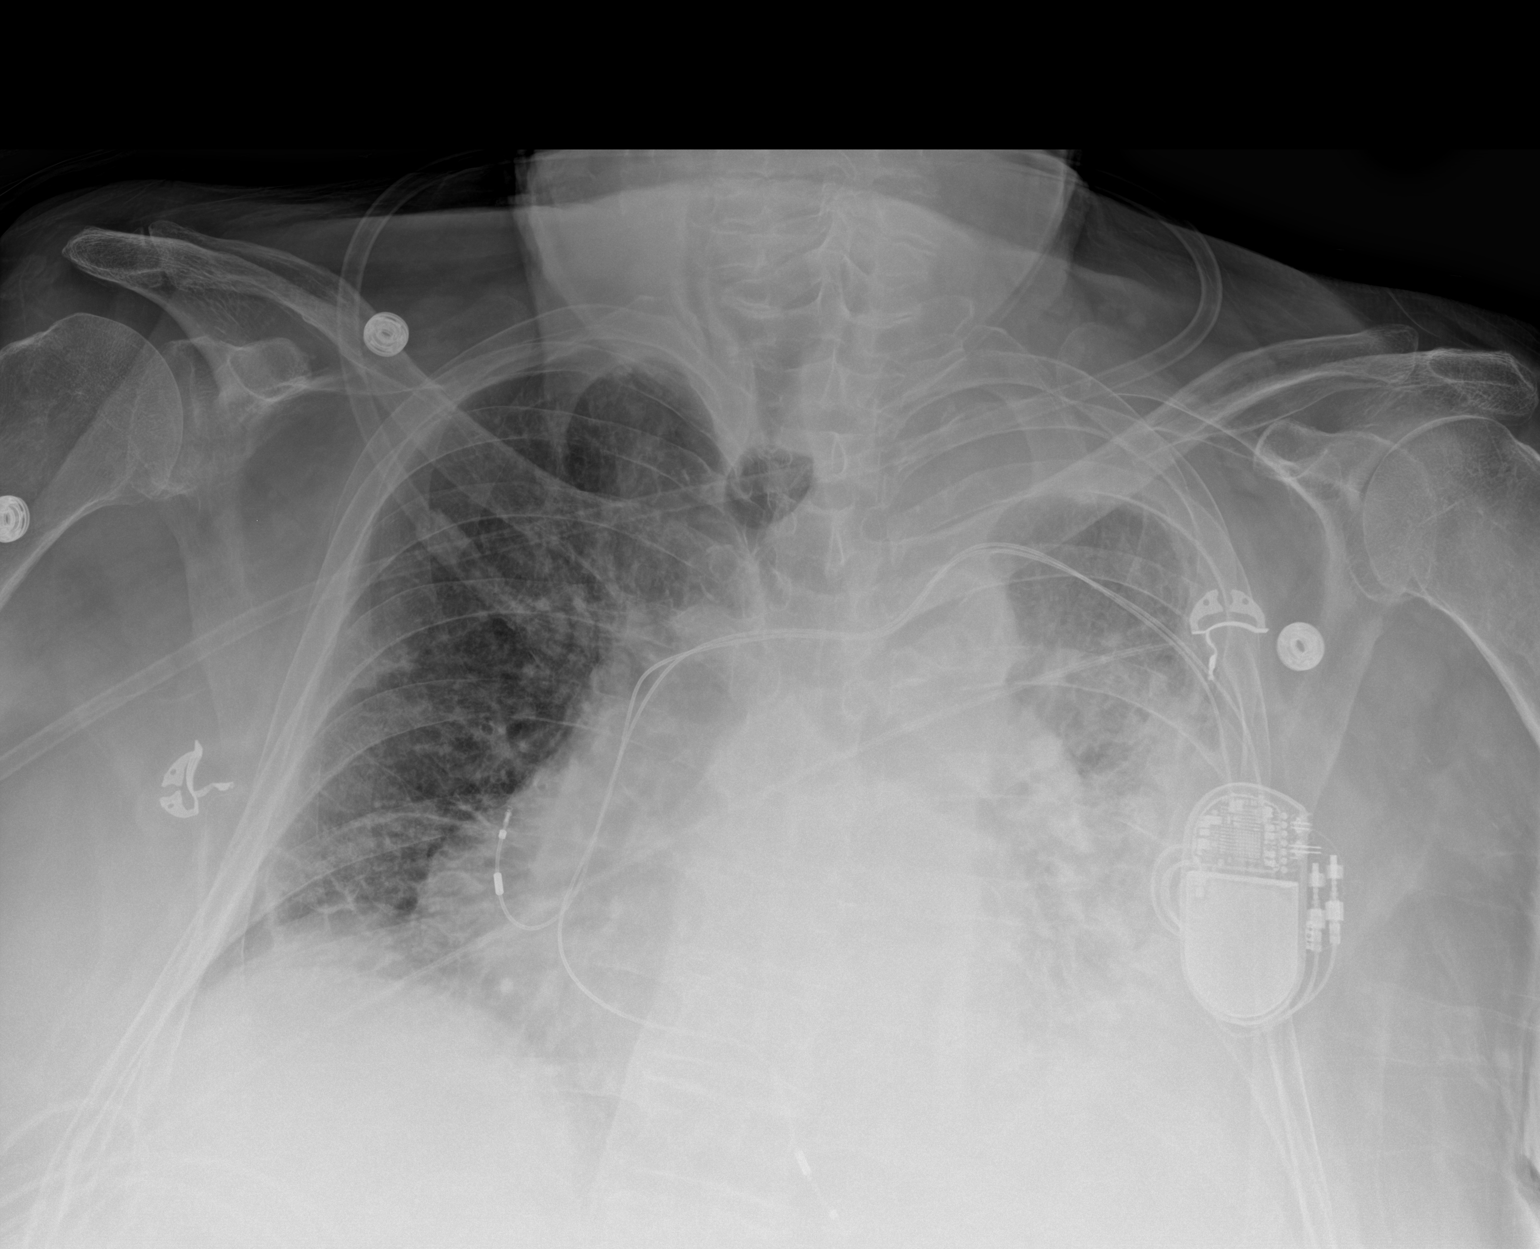

[1 of 1 positions shown; findings below may reference images not displayed]

FINDINGS: Cardiac shadow remains enlarged. Pacing device is again seen. The
right lung is hypoinflated. Increased aeration in the left lung is
noted when compared with the prior exam following thoracentesis.
There remains a small pleural effusion on the left. Repeat
thoracentesis tomorrow or the next day may be considered. No bony
abnormality is seen.
IMPRESSION: No pneumothorax is noted following thoracentesis. Improved aeration
on the left is noted although a small effusion remains. Repeat
thoracentesis on [DATE] or [DATE] may be helpful.
